# Patient Record
Sex: Female | Born: 1990 | Race: White | Hispanic: No | Marital: Married | Smoking: Never smoker
Health system: Southern US, Community
[De-identification: ages and names within clinical notes are randomized; demographics above are authoritative.]

## PROBLEM LIST (undated history)

## (undated) DIAGNOSIS — Z789 Other specified health status: Secondary | ICD-10-CM

## (undated) HISTORY — PX: CYSTOSCOPY: SUR368

## (undated) HISTORY — PX: WISDOM TOOTH EXTRACTION: SHX21

---

## 2019-12-27 LAB — OB RESULTS CONSOLE GC/CHLAMYDIA
Chlamydia: NEGATIVE
Gonorrhea: NEGATIVE

## 2019-12-27 LAB — OB RESULTS CONSOLE HIV ANTIBODY (ROUTINE TESTING): HIV: NONREACTIVE

## 2019-12-27 LAB — OB RESULTS CONSOLE ANTIBODY SCREEN: Antibody Screen: POSITIVE

## 2019-12-27 LAB — OB RESULTS CONSOLE ABO/RH: RH Type: NEGATIVE

## 2019-12-27 LAB — OB RESULTS CONSOLE RUBELLA ANTIBODY, IGM: Rubella: IMMUNE

## 2019-12-27 LAB — OB RESULTS CONSOLE RPR: RPR: NONREACTIVE

## 2019-12-27 LAB — OB RESULTS CONSOLE HEPATITIS B SURFACE ANTIGEN: Hepatitis B Surface Ag: NEGATIVE

## 2020-07-16 ENCOUNTER — Telehealth (HOSPITAL_COMMUNITY): Payer: Self-pay | Admitting: *Deleted

## 2020-07-16 ENCOUNTER — Encounter (HOSPITAL_COMMUNITY): Payer: Self-pay

## 2020-07-16 NOTE — Telephone Encounter (Signed)
Preadmission screen  

## 2020-07-16 NOTE — Patient Instructions (Addendum)
Karen Perkins  07/16/2020   Your procedure is scheduled on:  07/30/2020  Arrive at 0530 at Entrance C on [employer] [facility] at [facility]  and [employer]. You are invited to use the FREE valet parking or use the Visitor's parking deck.  Pick up the phone at the desk and dial [phone].  Call this number if you have problems the morning of surgery: [phone]  Remember:   Do not eat food:(After Midnight) Desps de medianoche.  Do not drink clear liquids: (After Midnight) Desps de medianoche.  Take these medicines the morning of surgery with A SIP OF WATER:  none   Do not wear jewelry, make-up or nail polish.  Do not wear lotions, powders, or perfumes. Do not wear deodorant.  Do not shave 48 hours prior to surgery.  Do not bring valuables to the hospital.  [facility] is not   responsible for any belongings or valuables brought to the hospital.  Contacts, dentures or bridgework may not be worn into surgery.  Leave suitcase in the car. After surgery it may be brought to your room.  For patients admitted to the hospital, checkout time is 11:00 AM the day of              discharge.      Please read over the following fact sheets that you were given:     Preparing for Surgery

## 2020-07-17 ENCOUNTER — Telehealth (HOSPITAL_COMMUNITY): Payer: Self-pay | Admitting: *Deleted

## 2020-07-17 ENCOUNTER — Encounter (HOSPITAL_COMMUNITY): Payer: Self-pay

## 2020-07-17 NOTE — Telephone Encounter (Signed)
Preadmission screen  

## 2020-07-27 NOTE — H&P (Signed)
Karen Perkins is a 29 y.o. female G2P0010 at 28 weeks presenting for primary C/S secondary to breech presentation.  Patient declined ECV.  Antepartum course uncomplicated.  OB History    Gravida  1   Para      Term      Preterm      AB      Living        SAB      TAB      Ectopic      Multiple      Live Births             Past Medical History:  Diagnosis Date  . Medical history non-contributory    Past Surgical History:  Procedure Laterality Date  . CYSTOSCOPY    . WISDOM TOOTH EXTRACTION     Family History: family history includes Diabetes in her mother. Social History:  reports that she has never smoked. She has never used smokeless tobacco. She reports previous alcohol use. She reports that she does not use drugs.     Maternal Diabetes: No Genetic Screening: Normal Maternal Ultrasounds/Referrals: Normal Fetal Ultrasounds or other Referrals:  None Maternal Substance Abuse:  No Significant Maternal Medications:  None Significant Maternal Lab Results:  Group B Strep negative and Rh negative Other Comments:  None  Review of Systems Maternal Medical History:  Prenatal complications: no prenatal complications Prenatal Complications - Diabetes: none.      There were no vitals taken for this visit. Maternal Exam:  Abdomen: Patient reports no abdominal tenderness. Fundal height is c/w dates.   Estimated fetal weight is 7#8.   Fetal presentation: breech     Physical Exam Constitutional:      Appearance: Normal appearance.  HENT:     Head: Normocephalic and atraumatic.  Pulmonary:     Effort: Pulmonary effort is normal.  Abdominal:     Palpations: Abdomen is soft.  Musculoskeletal:        General: Normal range of motion.     Cervical back: Normal range of motion.  Skin:    General: Skin is warm and dry.  Neurological:     Mental Status: She is alert.  Psychiatric:        Mood and Affect: Mood normal.        Behavior: Behavior normal.      Prenatal labs: ABO, Rh: AB/Negative/-- (01/19 0000) Antibody: Positive (01/19 0000) Rubella: Immune (01/19 0000) RPR: Nonreactive (01/19 0000)  HBsAg: Negative (01/19 0000)  HIV: Non-reactive (01/19 0000)  GBS:   Negative  Assessment/Plan: 28yo G2P0010 at 39 weeks for primary C/S for breech presentation Patient is counseled re: risk of bleeding, infection, scarring, and damage to surrounding structures.  She is informed of implications in future pregnancies including abnormal placentation and uterine rupture.  All questions were answered and patient wishes to proceed.   Mitchel Honour 07/27/2020, 8:26 AM

## 2020-07-28 ENCOUNTER — Other Ambulatory Visit: Payer: Self-pay

## 2020-07-28 ENCOUNTER — Other Ambulatory Visit (HOSPITAL_COMMUNITY)
Admission: RE | Admit: 2020-07-28 | Discharge: 2020-07-28 | Disposition: A | Discharge status: Home / Self Care | Payer: BC Managed Care – PPO | Source: Ambulatory Visit | Attending: Obstetrics and Gynecology | Admitting: Obstetrics and Gynecology

## 2020-07-28 DIAGNOSIS — Z20822 Contact with and (suspected) exposure to covid-19: Secondary | ICD-10-CM | POA: Insufficient documentation

## 2020-07-28 HISTORY — DX: Other specified health status: Z78.9

## 2020-07-28 LAB — CBC WITH DIFFERENTIAL/PLATELET
Abs Immature Granulocytes: 0.03 10*3/uL (ref 0.00–0.07)
Basophils Absolute: 0 10*3/uL (ref 0.0–0.1)
Basophils Relative: 0 %
Eosinophils Absolute: 0.1 10*3/uL (ref 0.0–0.5)
Eosinophils Relative: 1 %
HCT: 35.6 % — ABNORMAL LOW (ref 36.0–46.0)
Hemoglobin: 12.1 g/dL (ref 12.0–15.0)
Immature Granulocytes: 0 %
Lymphocytes Relative: 21 %
Lymphs Abs: 1.5 10*3/uL (ref 0.7–4.0)
MCH: 34.2 pg — ABNORMAL HIGH (ref 26.0–34.0)
MCHC: 34 g/dL (ref 30.0–36.0)
MCV: 100.6 fL — ABNORMAL HIGH (ref 80.0–100.0)
Monocytes Absolute: 0.5 10*3/uL (ref 0.1–1.0)
Monocytes Relative: 7 %
Neutro Abs: 4.9 10*3/uL (ref 1.7–7.7)
Neutrophils Relative %: 71 %
Platelets: 170 10*3/uL (ref 150–400)
RBC: 3.54 MIL/uL — ABNORMAL LOW (ref 3.87–5.11)
RDW: 12.3 % (ref 11.5–15.5)
WBC: 7 10*3/uL (ref 4.0–10.5)
nRBC: 0 % (ref 0.0–0.2)

## 2020-07-28 LAB — TYPE AND SCREEN
ABO/RH(D): AB NEG
Antibody Screen: POSITIVE

## 2020-07-28 LAB — SARS CORONAVIRUS 2 (TAT 6-24 HRS): SARS Coronavirus 2: NEGATIVE

## 2020-07-28 LAB — RPR: RPR Ser Ql: NONREACTIVE

## 2020-07-28 NOTE — MAU Note (Signed)
Pt here for covid swab and lab draw. Denies symptoms or sick contacts. Swab collected.  

## 2020-07-29 NOTE — Anesthesia Preprocedure Evaluation (Addendum)
Anesthesia Evaluation  Patient identified by MRN, date of birth, ID band Patient awake    Reviewed: Allergy & Precautions, H&P , NPO status , Patient's Chart, lab work & pertinent test results  Airway Mallampati: I  TM Distance: >3 FB Neck ROM: Full    Dental no notable dental hx. (+) Teeth Intact, Dental Advisory Given   Pulmonary neg pulmonary ROS,    Pulmonary exam normal breath sounds clear to auscultation       Cardiovascular Exercise Tolerance: Good negative cardio ROS Normal cardiovascular exam Rhythm:Regular Rate:Normal     Neuro/Psych negative neurological ROS  negative psych ROS   GI/Hepatic negative GI ROS, Neg liver ROS,   Endo/Other  negative endocrine ROS  Renal/GU negative Renal ROS  negative genitourinary   Musculoskeletal negative musculoskeletal ROS (+)   Abdominal   Peds negative pediatric ROS (+)  Hematology negative hematology ROS (+) Blood dyscrasia, anemia ,   Anesthesia Other Findings   Reproductive/Obstetrics negative OB ROS (+) Pregnancy                            Anesthesia Physical Anesthesia Plan  ASA: I  Anesthesia Plan: Spinal   Post-op Pain Management:    Induction:   PONV Risk Score and Plan: 2 and Scopolamine patch - Pre-op  Airway Management Planned:   Additional Equipment:   Intra-op Plan:   Post-operative Plan:   Informed Consent: I have reviewed the patients History and Physical, chart, labs and discussed the procedure including the risks, benefits and alternatives for the proposed anesthesia with the patient or authorized representative who has indicated his/her understanding and acceptance.       Plan Discussed with: Anesthesiologist and CRNA  Anesthesia Plan Comments: (  )       Anesthesia Quick Evaluation

## 2020-07-30 ENCOUNTER — Other Ambulatory Visit: Payer: Self-pay

## 2020-07-30 ENCOUNTER — Inpatient Hospital Stay (HOSPITAL_COMMUNITY)
Admission: RE | Admit: 2020-07-30 | Discharge: 2020-08-01 | DRG: 788 | Disposition: A | Discharge status: Home / Self Care | Payer: BC Managed Care – PPO | Attending: Obstetrics & Gynecology | Admitting: Obstetrics & Gynecology

## 2020-07-30 ENCOUNTER — Inpatient Hospital Stay (HOSPITAL_COMMUNITY): Payer: BC Managed Care – PPO | Admitting: Anesthesiology

## 2020-07-30 ENCOUNTER — Encounter (HOSPITAL_COMMUNITY): Payer: Self-pay | Admitting: Obstetrics & Gynecology

## 2020-07-30 ENCOUNTER — Encounter (HOSPITAL_COMMUNITY)
Admission: RE | Disposition: A | Discharge status: Home / Self Care | Payer: Self-pay | Source: Home / Self Care | Attending: Obstetrics & Gynecology

## 2020-07-30 DIAGNOSIS — O26893 Other specified pregnancy related conditions, third trimester: Secondary | ICD-10-CM | POA: Diagnosis present

## 2020-07-30 DIAGNOSIS — O321XX0 Maternal care for breech presentation, not applicable or unspecified: Principal | ICD-10-CM | POA: Diagnosis present

## 2020-07-30 DIAGNOSIS — Z20822 Contact with and (suspected) exposure to covid-19: Secondary | ICD-10-CM | POA: Diagnosis present

## 2020-07-30 DIAGNOSIS — Z3A39 39 weeks gestation of pregnancy: Secondary | ICD-10-CM

## 2020-07-30 DIAGNOSIS — Z6791 Unspecified blood type, Rh negative: Secondary | ICD-10-CM | POA: Diagnosis not present

## 2020-07-30 SURGERY — Surgical Case
Anesthesia: Spinal

## 2020-07-30 MED ORDER — BUPIVACAINE IN DEXTROSE 0.75-8.25 % IT SOLN
INTRATHECAL | Status: DC | PRN
  Administered 2020-07-30: 1.6 mL via INTRATHECAL

## 2020-07-30 MED ORDER — OXYTOCIN-SODIUM CHLORIDE 30-0.9 UT/500ML-% IV SOLN: 2.5000 [IU]/h | INTRAVENOUS | Status: AC

## 2020-07-30 MED ORDER — PHENYLEPHRINE 40 MCG/ML (10ML) SYRINGE FOR IV PUSH (FOR BLOOD PRESSURE SUPPORT)
PREFILLED_SYRINGE | INTRAVENOUS | Status: AC
  Filled 2020-07-30: qty 10

## 2020-07-30 MED ORDER — TETANUS-DIPHTH-ACELL PERTUSSIS 5-2.5-18.5 LF-MCG/0.5 IM SUSP: 0.5000 mL | Freq: Once | INTRAMUSCULAR | Status: DC

## 2020-07-30 MED ORDER — NALOXONE HCL 0.4 MG/ML IJ SOLN: 0.4000 mg | INTRAMUSCULAR | Status: DC | PRN

## 2020-07-30 MED ORDER — NALOXONE HCL 4 MG/10ML IJ SOLN
1.0000 ug/kg/h | INTRAVENOUS | Status: DC | PRN
  Filled 2020-07-30: qty 5

## 2020-07-30 MED ORDER — FENTANYL CITRATE (PF) 100 MCG/2ML IJ SOLN
INTRAMUSCULAR | Status: DC | PRN
Start: 2020-07-30 — End: 2020-07-30
  Administered 2020-07-30: 15 ug via INTRATHECAL

## 2020-07-30 MED ORDER — DIBUCAINE (PERIANAL) 1 % EX OINT: 1.0000 "application " | TOPICAL_OINTMENT | CUTANEOUS | Status: DC | PRN

## 2020-07-30 MED ORDER — DIPHENHYDRAMINE HCL 25 MG PO CAPS: 25.0000 mg | ORAL_CAPSULE | ORAL | Status: DC | PRN

## 2020-07-30 MED ORDER — PHENYLEPHRINE HCL-NACL 20-0.9 MG/250ML-% IV SOLN
INTRAVENOUS | Status: DC | PRN
  Administered 2020-07-30: 60 ug/min via INTRAVENOUS

## 2020-07-30 MED ORDER — DIPHENHYDRAMINE HCL 50 MG/ML IJ SOLN: 12.5000 mg | INTRAMUSCULAR | Status: DC | PRN

## 2020-07-30 MED ORDER — WITCH HAZEL-GLYCERIN EX PADS: 1.0000 "application " | MEDICATED_PAD | CUTANEOUS | Status: DC | PRN

## 2020-07-30 MED ORDER — MENTHOL 3 MG MT LOZG: 1.0000 | LOZENGE | OROMUCOSAL | Status: DC | PRN

## 2020-07-30 MED ORDER — NALBUPHINE HCL 10 MG/ML IJ SOLN
5.0000 mg | Freq: Once | INTRAMUSCULAR | Status: AC | PRN
  Administered 2020-07-31: 5 mg via INTRAVENOUS
  Filled 2020-07-30: qty 1

## 2020-07-30 MED ORDER — LACTATED RINGERS IV SOLN: INTRAVENOUS | Status: DC

## 2020-07-30 MED ORDER — ACETAMINOPHEN 325 MG PO TABS
650.0000 mg | ORAL_TABLET | ORAL | Status: DC | PRN
  Administered 2020-07-30 – 2020-07-31 (×2): 650 mg via ORAL
  Filled 2020-07-30 (×2): qty 2

## 2020-07-30 MED ORDER — SIMETHICONE 80 MG PO CHEW
80.0000 mg | CHEWABLE_TABLET | Freq: Three times a day (TID) | ORAL | Status: DC
  Administered 2020-07-30 – 2020-08-01 (×7): 80 mg via ORAL
  Filled 2020-07-30 (×7): qty 1

## 2020-07-30 MED ORDER — HYDROMORPHONE HCL 1 MG/ML IJ SOLN
INTRAMUSCULAR | Status: AC
  Filled 2020-07-30: qty 0.5

## 2020-07-30 MED ORDER — NALBUPHINE HCL 10 MG/ML IJ SOLN
5.0000 mg | INTRAMUSCULAR | Status: DC | PRN
  Administered 2020-07-31: 5 mg via INTRAVENOUS
  Filled 2020-07-30: qty 1

## 2020-07-30 MED ORDER — SODIUM CHLORIDE 0.9 % IV SOLN
INTRAVENOUS | Status: AC
  Filled 2020-07-30: qty 2

## 2020-07-30 MED ORDER — ONDANSETRON HCL 4 MG/2ML IJ SOLN: 4.0000 mg | Freq: Three times a day (TID) | INTRAMUSCULAR | Status: DC | PRN

## 2020-07-30 MED ORDER — NALBUPHINE HCL 10 MG/ML IJ SOLN: 5.0000 mg | INTRAMUSCULAR | Status: DC | PRN

## 2020-07-30 MED ORDER — KETOROLAC TROMETHAMINE 30 MG/ML IJ SOLN
30.0000 mg | Freq: Four times a day (QID) | INTRAMUSCULAR | Status: AC | PRN
  Administered 2020-07-30: 30 mg via INTRAMUSCULAR

## 2020-07-30 MED ORDER — COCONUT OIL OIL: 1.0000 "application " | TOPICAL_OIL | Status: DC | PRN

## 2020-07-30 MED ORDER — SODIUM CHLORIDE 0.9 % IV SOLN
2.0000 g | INTRAVENOUS | Status: AC
  Administered 2020-07-30: 2 g via INTRAVENOUS

## 2020-07-30 MED ORDER — HYDROMORPHONE HCL 1 MG/ML IJ SOLN
0.5000 mg | Freq: Once | INTRAMUSCULAR | Status: AC
  Administered 2020-07-30: 0.5 mg via INTRAVENOUS

## 2020-07-30 MED ORDER — METOCLOPRAMIDE HCL 5 MG/ML IJ SOLN
INTRAMUSCULAR | Status: AC
  Filled 2020-07-30: qty 2

## 2020-07-30 MED ORDER — KETOROLAC TROMETHAMINE 30 MG/ML IJ SOLN
30.0000 mg | Freq: Four times a day (QID) | INTRAMUSCULAR | Status: AC | PRN
  Administered 2020-07-30: 30 mg via INTRAVENOUS
  Filled 2020-07-30: qty 1

## 2020-07-30 MED ORDER — DEXAMETHASONE SODIUM PHOSPHATE 4 MG/ML IJ SOLN
INTRAMUSCULAR | Status: AC
  Filled 2020-07-30: qty 2

## 2020-07-30 MED ORDER — PRENATAL MULTIVITAMIN CH
1.0000 | ORAL_TABLET | Freq: Every day | ORAL | Status: DC
  Administered 2020-07-31 – 2020-08-01 (×2): 1 via ORAL
  Filled 2020-07-30 (×2): qty 1

## 2020-07-30 MED ORDER — POVIDONE-IODINE 10 % EX SWAB: 2.0000 "application " | Freq: Once | CUTANEOUS | Status: DC

## 2020-07-30 MED ORDER — OXYCODONE-ACETAMINOPHEN 5-325 MG PO TABS
1.0000 | ORAL_TABLET | ORAL | Status: DC | PRN
  Administered 2020-07-30: 2 via ORAL
  Filled 2020-07-30: qty 2

## 2020-07-30 MED ORDER — SCOPOLAMINE 1 MG/3DAYS TD PT72
1.0000 | MEDICATED_PATCH | Freq: Once | TRANSDERMAL | Status: DC
  Administered 2020-07-30: 1.5 mg via TRANSDERMAL

## 2020-07-30 MED ORDER — FENTANYL CITRATE (PF) 100 MCG/2ML IJ SOLN
INTRAMUSCULAR | Status: AC
  Filled 2020-07-30: qty 2

## 2020-07-30 MED ORDER — ONDANSETRON HCL 4 MG/2ML IJ SOLN
INTRAMUSCULAR | Status: AC
  Filled 2020-07-30: qty 2

## 2020-07-30 MED ORDER — MEPERIDINE HCL 25 MG/ML IJ SOLN: 6.2500 mg | INTRAMUSCULAR | Status: DC | PRN

## 2020-07-30 MED ORDER — SODIUM CHLORIDE 0.9 % IR SOLN
Status: DC | PRN
  Administered 2020-07-30: 1

## 2020-07-30 MED ORDER — IBUPROFEN 800 MG PO TABS
800.0000 mg | ORAL_TABLET | Freq: Four times a day (QID) | ORAL | Status: DC
  Administered 2020-07-31 – 2020-08-01 (×7): 800 mg via ORAL
  Filled 2020-07-30 (×8): qty 1

## 2020-07-30 MED ORDER — OXYTOCIN-SODIUM CHLORIDE 30-0.9 UT/500ML-% IV SOLN
INTRAVENOUS | Status: AC
  Filled 2020-07-30: qty 500

## 2020-07-30 MED ORDER — SENNOSIDES-DOCUSATE SODIUM 8.6-50 MG PO TABS
2.0000 | ORAL_TABLET | ORAL | Status: DC
  Administered 2020-07-31 (×2): 2 via ORAL
  Filled 2020-07-30 (×2): qty 2

## 2020-07-30 MED ORDER — PHENYLEPHRINE HCL-NACL 20-0.9 MG/250ML-% IV SOLN
INTRAVENOUS | Status: AC
  Filled 2020-07-30: qty 250

## 2020-07-30 MED ORDER — NALBUPHINE HCL 10 MG/ML IJ SOLN: 5.0000 mg | Freq: Once | INTRAMUSCULAR | Status: AC | PRN

## 2020-07-30 MED ORDER — SIMETHICONE 80 MG PO CHEW
80.0000 mg | CHEWABLE_TABLET | ORAL | Status: DC
  Administered 2020-07-31 (×2): 80 mg via ORAL
  Filled 2020-07-30 (×2): qty 1

## 2020-07-30 MED ORDER — MORPHINE SULFATE (PF) 0.5 MG/ML IJ SOLN
INTRAMUSCULAR | Status: DC | PRN
  Administered 2020-07-30: .15 ug via INTRATHECAL

## 2020-07-30 MED ORDER — METOCLOPRAMIDE HCL 5 MG/ML IJ SOLN
INTRAMUSCULAR | Status: DC | PRN
  Administered 2020-07-30 (×2): 5 mg via INTRAVENOUS

## 2020-07-30 MED ORDER — DIPHENHYDRAMINE HCL 25 MG PO CAPS: 25.0000 mg | ORAL_CAPSULE | Freq: Four times a day (QID) | ORAL | Status: DC | PRN

## 2020-07-30 MED ORDER — ZOLPIDEM TARTRATE 5 MG PO TABS: 5.0000 mg | ORAL_TABLET | Freq: Every evening | ORAL | Status: DC | PRN

## 2020-07-30 MED ORDER — SIMETHICONE 80 MG PO CHEW: 80.0000 mg | CHEWABLE_TABLET | ORAL | Status: DC | PRN

## 2020-07-30 MED ORDER — SODIUM CHLORIDE 0.9% FLUSH: 3.0000 mL | INTRAVENOUS | Status: DC | PRN

## 2020-07-30 MED ORDER — SCOPOLAMINE 1 MG/3DAYS TD PT72
MEDICATED_PATCH | TRANSDERMAL | Status: AC
  Filled 2020-07-30: qty 1

## 2020-07-30 MED ORDER — MORPHINE SULFATE (PF) 0.5 MG/ML IJ SOLN
INTRAMUSCULAR | Status: AC
  Filled 2020-07-30: qty 10

## 2020-07-30 MED ORDER — KETOROLAC TROMETHAMINE 30 MG/ML IJ SOLN
INTRAMUSCULAR | Status: AC
  Filled 2020-07-30: qty 1

## 2020-07-30 MED ORDER — OXYTOCIN-SODIUM CHLORIDE 30-0.9 UT/500ML-% IV SOLN
INTRAVENOUS | Status: DC | PRN
  Administered 2020-07-30: 300 mL via INTRAVENOUS

## 2020-07-30 MED ORDER — LACTATED RINGERS IV SOLN: INTRAVENOUS | Status: DC | PRN

## 2020-07-30 SURGICAL SUPPLY — 36 items
BENZOIN TINCTURE PRP APPL 2/3 (GAUZE/BANDAGES/DRESSINGS) ×3 IMPLANT
CHLORAPREP W/TINT 26ML (MISCELLANEOUS) ×3 IMPLANT
CLAMP CORD UMBIL (MISCELLANEOUS) IMPLANT
CLOSURE STERI STRIP 1/2 X4 (GAUZE/BANDAGES/DRESSINGS) ×3 IMPLANT
CLOSURE WOUND 1/2 X4 (GAUZE/BANDAGES/DRESSINGS)
CLOTH BEACON ORANGE TIMEOUT ST (SAFETY) ×3 IMPLANT
DERMABOND ADVANCED (GAUZE/BANDAGES/DRESSINGS)
DERMABOND ADVANCED .7 DNX12 (GAUZE/BANDAGES/DRESSINGS) IMPLANT
DRSG OPSITE POSTOP 4X10 (GAUZE/BANDAGES/DRESSINGS) ×3 IMPLANT
DRSG PAD ABDOMINAL 8X10 ST (GAUZE/BANDAGES/DRESSINGS) ×3 IMPLANT
ELECT REM PT RETURN 9FT ADLT (ELECTROSURGICAL) ×3
ELECTRODE REM PT RTRN 9FT ADLT (ELECTROSURGICAL) ×1 IMPLANT
EXTRACTOR VACUUM KIWI (MISCELLANEOUS) IMPLANT
GAUZE SPONGE 4X4 3PLY NS LF (GAUZE/BANDAGES/DRESSINGS) ×6 IMPLANT
GLOVE BIO SURGEON STRL SZ 6 (GLOVE) ×3 IMPLANT
GLOVE BIOGEL PI IND STRL 6 (GLOVE) ×2 IMPLANT
GLOVE BIOGEL PI IND STRL 7.0 (GLOVE) ×1 IMPLANT
GLOVE BIOGEL PI INDICATOR 6 (GLOVE) ×4
GLOVE BIOGEL PI INDICATOR 7.0 (GLOVE) ×2
GOWN STRL REUS W/TWL LRG LVL3 (GOWN DISPOSABLE) ×6 IMPLANT
KIT ABG SYR 3ML LUER SLIP (SYRINGE) ×3 IMPLANT
NEEDLE HYPO 25X5/8 SAFETYGLIDE (NEEDLE) ×3 IMPLANT
NS IRRIG 1000ML POUR BTL (IV SOLUTION) ×3 IMPLANT
PACK C SECTION WH (CUSTOM PROCEDURE TRAY) ×3 IMPLANT
PAD OB MATERNITY 4.3X12.25 (PERSONAL CARE ITEMS) ×3 IMPLANT
PENCIL SMOKE EVAC W/HOLSTER (ELECTROSURGICAL) ×3 IMPLANT
STRIP CLOSURE SKIN 1/2X4 (GAUZE/BANDAGES/DRESSINGS) IMPLANT
SUT CHROMIC 0 CTX 36 (SUTURE) ×9 IMPLANT
SUT MON AB 2-0 CT1 27 (SUTURE) ×3 IMPLANT
SUT PDS AB 0 CT1 27 (SUTURE) IMPLANT
SUT PLAIN 0 NONE (SUTURE) IMPLANT
SUT VIC AB 0 CT1 36 (SUTURE) IMPLANT
SUT VIC AB 4-0 KS 27 (SUTURE) IMPLANT
TOWEL OR 17X24 6PK STRL BLUE (TOWEL DISPOSABLE) ×3 IMPLANT
TRAY FOLEY W/BAG SLVR 14FR LF (SET/KITS/TRAYS/PACK) IMPLANT
WATER STERILE IRR 1000ML POUR (IV SOLUTION) ×3 IMPLANT

## 2020-07-30 NOTE — Op Note (Signed)
Karen Perkins PROCEDURE DATE: 07/30/2020  PREOPERATIVE DIAGNOSIS: Intrauterine pregnancy at  [redacted]w[redacted]d weeks gestation, breech presentation  POSTOPERATIVE DIAGNOSIS: The same  PROCEDURE:  Primary Low Transverse Cesarean Section  SURGEON:  Dr. Mitchel Honour  INDICATIONS: Karen Perkins is a 29 y.o. G1P0 at [redacted]w[redacted]d scheduled for cesarean section secondary to breech presentation.  The risks of cesarean section discussed with the patient included but were not limited to: bleeding which may require transfusion or reoperation; infection which may require antibiotics; injury to bowel, bladder, ureters or other surrounding organs; injury to the fetus; need for additional procedures including hysterectomy in the event of a life-threatening hemorrhage; placental abnormalities wth subsequent pregnancies, incisional problems, thromboembolic phenomenon and other postoperative/anesthesia complications. The patient concurred with the proposed plan, giving informed written consent for the procedure.    FINDINGS:  Viable female infant in frank breech presentation, APGARs 8,9: weight pending  Clear amniotic fluid.  Intact placenta, three vessel cord.  Grossly normal uterus, ovaries and fallopian tubes. .   ANESTHESIA:  Spinal ESTIMATED BLOOD LOSS: 600 ml SPECIMENS: Placenta sent to L&D COMPLICATIONS: None immediate  PROCEDURE IN DETAIL:  The patient received intravenous antibiotics and had sequential compression devices applied to her lower extremities while in the preoperative area.  She was then taken to the operating room where spinal anesthesia was administered and was found to be adequate. She was then placed in a dorsal supine position with a leftward tilt, and prepped and draped in a sterile manner.  A foley catheter was placed into her bladder and attached to constant gravity.  After an adequate timeout was performed, a Pfannenstiel skin incision was made with scalpel and carried through to the underlying layer of  fascia. The fascia was incised in the midline and this incision was extended bilaterally using the Mayo scissors. Kocher clamps were applied to the superior aspect of the fascial incision and the underlying rectus muscles were dissected off bluntly. A similar process was carried out on the inferior aspect of the facial incision. The rectus muscles were separated in the midline bluntly and the peritoneum was entered bluntly. Bladder flap was created sharply and developed bluntly.  Bladder blade was placed.  A transverse hysterotomy was made with a scalpel and extended bilaterally bluntly. The bladder blade was then removed. The infant was successfully delivered using typical breech maneuvers, and cord was clamped and cut and infant was handed over to awaiting neonatology team. Uterine massage was then administered and the placenta delivered intact with three-vessel cord. The uterus was cleared of clot and debris.  The hysterotomy was closed with 0 chromic.  A second imbricating suture of 0-chromic was used to reinforce the incision and aid in hemostasis.  The peritoneum and rectus muscles were noted to be hemostatic and were reapproximated usind 3-0 monocryl in a running fashion.  The fascia was closed with 0-Vicryl in a running fashion with good restoration of anatomy.  The subcutaneus tissue was copiously irrigated.  The skin was closed with 4-0 vicryl in a subcuticular fashion.  Pt tolerated the procedure will.  All counts were correct x2.  Pt went to the recovery room in stable condition.

## 2020-07-30 NOTE — Lactation Note (Signed)
This note was copied from a baby's chart. Lactation Consultation Note  Patient Name: Karen Perkins OEVOJ'J Date: 07/30/2020 Reason for consult: Initial assessment;Primapara;1st time breastfeeding;Term  Visited with mom of a 64 hours old FT female, she's a P1 and reported moderate breast changes during the pregnancy. She has a Spectra DEBP at home. RN Misty Stanley asked LC to assist mother due to a difficult latch, baby had a good feeding right after birth but he's been sleepy since then.  RN Misty Stanley had already assisted with hand expression since baby didn't latch, mom and baby doing STS when entering the room. Offered assistance with latch and mom agreed to wake baby up to feed. LC positioned baby in cross cradle hold at the right breast first and he latch for about 3 minutes, but required some repositioning. A few audible swallows noted when doing breast compressions.   Mom had her IV on that hand and asked LC to switch breast instead, baby latched again but only for about 2 minutes on the other breast. Baby kept falling asleep, when mom asked LC to try football hold, baby started crying and shivering, LC put on baby's hat. Baby only did a few sucks on football hold, LC then assisted mom with hand expression and spoon feeding and fed baby about 1 ml of colostrum.  LC placed baby back to mom's chest doing STS and asked her to call for assistance when needed; baby fell asleep shortly after. Reviewed normal newborn behavior, cluster feeding, feeding cues, size of baby's stomach and breastfeeding tools (such as pumping and NS) in case baby is still having difficulty latching on by the 24 hours mark.  Feeding plan:  1. Encouraged mom to feed baby STS 8-12 times/24 hours or sooner if feeding cues are present 2. Hand expression and spoon feeding were strongly encouraged, especially if baby is not latching/cueing  BF brochure, BF resources and feeding diary were reviewed. Dad present and supportive. Parents  reported all questions and concerns were answered, they're both aware of LC OP services and will call PRN.   Maternal Data Formula Feeding for Exclusion: No Has patient been taught Hand Expression?: Yes Does the patient have breastfeeding experience prior to this delivery?: No  Feeding Feeding Type: Breast Fed  LATCH Score Latch: Repeated attempts needed to sustain latch, nipple held in mouth throughout feeding, stimulation needed to elicit sucking reflex. (on and off)  Audible Swallowing: A few with stimulation (only when compressing)  Type of Nipple: Everted at rest and after stimulation  Comfort (Breast/Nipple): Soft / non-tender  Hold (Positioning): Assistance needed to correctly position infant at breast and maintain latch.  LATCH Score: 7  Interventions Interventions: Breast feeding basics reviewed;Assisted with latch;Skin to skin;Breast massage;Hand express;Breast compression;Adjust position;Support pillows;Position options  Lactation Tools Discussed/Used WIC Program: No   Consult Status Consult Status: Follow-up Date: 07/31/20 Follow-up type: In-patient    Liyla Radliff Venetia Constable 07/30/2020, 7:43 PM

## 2020-07-30 NOTE — Transfer of Care (Signed)
Immediate Anesthesia Transfer of Care Note  Patient: Karen Perkins  Procedure(s) Performed: PRIMARY CESAREAN SECTION EDC: 08-06-20  NKDA  NEED RNFA (N/A )  Patient Location: PACU  Anesthesia Type:Spinal  Level of Consciousness: awake, alert  and oriented  Airway & Oxygen Therapy: Patient Spontanous Breathing  Post-op Assessment: Report given to RN and Post -op Vital signs reviewed and stable  Post vital signs: Reviewed and stable  Last Vitals:  Vitals Value Taken Time  BP 113/58 07/30/20 0823  Temp    Pulse 71 07/30/20 0825  Resp 21 07/30/20 0825  SpO2 95 % 07/30/20 0825  Vitals shown include unvalidated device data.  Last Pain:  Vitals:   07/30/20 0600  TempSrc: Oral         Complications: No complications documented.

## 2020-07-30 NOTE — Anesthesia Postprocedure Evaluation (Signed)
Anesthesia Post Note  Patient: Karen Perkins  Procedure(s) Performed: PRIMARY CESAREAN SECTION EDC: 08-06-20  NKDA  NEED RNFA (N/A )     Patient location during evaluation: PACU Anesthesia Type: Spinal Level of consciousness: oriented and awake and alert Pain management: pain level controlled Vital Signs Assessment: post-procedure vital signs reviewed and stable Respiratory status: spontaneous breathing, respiratory function stable and patient connected to nasal cannula oxygen Cardiovascular status: blood pressure returned to baseline and stable Postop Assessment: no headache, no backache and no apparent nausea or vomiting Anesthetic complications: no   No complications documented.  Last Vitals:  Vitals:   07/30/20 0823 07/30/20 0830  BP: (!) 113/58 112/63  Pulse: 73 73  Resp: 20 20  Temp: 36.6 C   SpO2: 95% 95%    Last Pain:  Vitals:   07/30/20 0600  TempSrc: Oral   Pain Goal:    LLE Motor Response: No movement due to regional block, No movement to painful stimulus (07/30/20 0830) LLE Sensation: No sensation (absent) (07/30/20 0830) RLE Motor Response: No movement due to regional block, No movement to painful stimulus (07/30/20 0830) RLE Sensation: No sensation (absent) (07/30/20 0830)     Epidural/Spinal Function Cutaneous sensation: No Sensation (07/30/20 0830), Patient able to flex knees: No (07/30/20 0830), Patient able to lift hips off bed: No (07/30/20 0830), Back pain beyond tenderness at insertion site: No (07/30/20 0830), Progressively worsening motor and/or sensory loss: No (07/30/20 0830), Bowel and/or bladder incontinence post epidural: No (07/30/20 0830)  Ansel Ferrall

## 2020-07-30 NOTE — Progress Notes (Signed)
No change to H&P.  Bedside u/s confirms breech presentation.  Richerd Grime, DO 

## 2020-07-30 NOTE — Progress Notes (Signed)
Karen Perkins wishes to work with lactation during her stay.

## 2020-07-30 NOTE — Anesthesia Procedure Notes (Signed)
Spinal  Patient location during procedure: OR Start time: 07/30/2020 7:20 AM End time: 07/30/2020 7:25 AM Staffing Anesthesiologist: Bethena Midget, MD Preanesthetic Checklist Completed: patient identified, IV checked, site marked, risks and benefits discussed, surgical consent, monitors and equipment checked, pre-op evaluation and timeout performed Spinal Block Patient position: sitting Prep: DuraPrep Patient monitoring: heart rate, cardiac monitor, continuous pulse ox and blood pressure Approach: midline Location: L4-5 Injection technique: single-shot Needle Needle type: Sprotte  Needle gauge: 24 G Needle length: 9 cm Assessment Sensory level: T6

## 2020-07-31 LAB — CBC
HCT: 29.7 % — ABNORMAL LOW (ref 36.0–46.0)
Hemoglobin: 10.1 g/dL — ABNORMAL LOW (ref 12.0–15.0)
MCH: 34.2 pg — ABNORMAL HIGH (ref 26.0–34.0)
MCHC: 34 g/dL (ref 30.0–36.0)
MCV: 100.7 fL — ABNORMAL HIGH (ref 80.0–100.0)
Platelets: 168 10*3/uL (ref 150–400)
RBC: 2.95 MIL/uL — ABNORMAL LOW (ref 3.87–5.11)
RDW: 12.6 % (ref 11.5–15.5)
WBC: 12.2 10*3/uL — ABNORMAL HIGH (ref 4.0–10.5)
nRBC: 0 % (ref 0.0–0.2)

## 2020-07-31 LAB — BIRTH TISSUE RECOVERY COLLECTION (PLACENTA DONATION)

## 2020-07-31 MED ORDER — RHO D IMMUNE GLOBULIN 1500 UNIT/2ML IJ SOSY
300.0000 ug | PREFILLED_SYRINGE | Freq: Once | INTRAMUSCULAR | Status: AC
  Administered 2020-07-31: 300 ug via INTRAVENOUS
  Filled 2020-07-31: qty 2

## 2020-07-31 NOTE — Lactation Note (Signed)
This note was copied from a baby's chart. Lactation Consultation Note  Patient Name: Karen Perkins INOMV'E Date: 07/31/2020 Reason for consult: Follow-up assessment   Mother reports that breastfeeding is going well. She declines having any questions. She does report slight soreness of her nipples. Observed that her nipple are pink. She is applying colostrum when hand expresses.  Mother reports that she is distracted due to nurse assessing infant. She reports she would like to have LC visit later.    Maternal Data    Feeding    LATCH Score                   Interventions    Lactation Tools Discussed/Used     Consult Status Consult Status: Follow-up Date: 07/31/20 Follow-up type: In-patient    Stevan Born [facility] 07/31/2020, 3:01 PM

## 2020-07-31 NOTE — Progress Notes (Signed)
Subjective: Postpartum Day 1: Cesarean Delivery Patient reports tolerating PO, + flatus and no problems voiding.  Desires circ.  Objective: Vital signs in last 24 hours: Temp:  [97.5 F (36.4 C)-98.1 F (36.7 C)] 98 F (36.7 C) (08/24 0300) Pulse Rate:  [53-65] 64 (08/24 0300) Resp:  [16-18] 18 (08/24 0300) BP: (83-116)/(53-82) 116/82 (08/24 0300) SpO2:  [96 %-99 %] 98 % (08/23 1700)  Physical Exam:  General: alert, cooperative and appears stated age Lochia: appropriate Uterine Fundus: firm Incision: healing well, no significant drainage, no dehiscence; pressure dressing moved.  Honeycomb in place with half-dollar sized stain on right side of dressing. DVT Evaluation: No evidence of DVT seen on physical exam. Negative Homan's sign. No cords or calf tenderness.  Recent Labs    07/31/20 0434  HGB 10.1*  HCT 29.7*    Assessment/Plan: Status post Cesarean section. Doing well postoperatively.  Continue current care. Circ-patient counseled re: risk of bleeding, infection and scarring.  All questions were answered and patient wishes to proceed.   Mitchel Honour 07/31/2020, 8:43 AM

## 2020-08-01 LAB — RH IG WORKUP (INCLUDES ABO/RH)
ABO/RH(D): AB NEG
Fetal Screen: NEGATIVE
Gestational Age(Wks): 39
Unit division: 0

## 2020-08-01 MED ORDER — ACETAMINOPHEN 325 MG PO TABS: 650.0000 mg | ORAL_TABLET | Freq: Four times a day (QID) | ORAL | 0 refills | Status: AC | PRN

## 2020-08-01 MED ORDER — IBUPROFEN 800 MG PO TABS
800.0000 mg | ORAL_TABLET | Freq: Four times a day (QID) | ORAL | 0 refills | Status: DC | PRN
Start: 2020-08-01 — End: 2023-02-05

## 2020-08-01 NOTE — Progress Notes (Signed)
Subjective: Postpartum Day 2: Cesarean Delivery Patient reports tolerating PO, + flatus and no problems voiding.   Baby was "inconsolable" last pm. Patient is very tired and uncertain if she wants to go home today Objective: Vital signs in last 24 hours: Temp:  [98.1 F (36.7 C)-98.5 F (36.9 C)] 98.5 F (36.9 C) (08/25 0658) Pulse Rate:  [62-84] 84 (08/25 0658) Resp:  [16-18] 16 (08/25 0658) BP: (110-118)/(62-73) 112/62 (08/25 0658) SpO2:  [98 %] 98 % (08/25 7915)  Physical Exam:  General: alert, cooperative and no distress Lochia: appropriate Uterine Fundus: firm Incision: healing well DVT Evaluation: No evidence of DVT seen on physical exam.  Recent Labs    07/31/20 0434  HGB 10.1*  HCT 29.7*    Assessment/Plan: Status post Cesarean section. Doing well postoperatively.  Continue current care. D/W discharge instructions in case she wants discharge later today. I encouraged her to use resources here at hospital if she needs the support.  Roselle Locus II 08/01/2020, 7:21 AM

## 2020-08-01 NOTE — Lactation Note (Addendum)
This note was copied from a baby's chart. Lactation Consultation Note  Patient Name: Boy Karletta Millay TDVVO'[id number] Date: 08/01/2020 Reason for consult: Follow-up assessment  Baby boy Mungro circumsized today.  Baby boy Mungro breech born via csection.Marland Kitchen  He is very fussy on arrival.  Parents report recently fed for  20 minutes and he is still fussy. Infant with 6.5 percent weight loss, mom has abrasions on both nipples. Did not do NEWT at this time. Parents report they feel he is breastfeeding okay.  Discussed with parents possible supplementation.  Discussed breastmilk, formula or donor milk.  Donor milk not available at this time.  As I am talking with parents, mom started crying and dad asked why they need to supplement.  Mom reports this is just all overwhelming.  Dad reports she hadn't had any sleep. Asked mom if we could try some hand expression and spoon feeding and intitiate pumping.   Easily able to hand express and spoon feed. Fed infant close to 10 ml via spoon. Attempt to get mom to help. Mom puts hand close to nipple and LC not able to get spoon under to catch drops.  Mom agreed to try pumping with DEBP.  Mom used DEBP   and got about 5 ml of colsotrum.  LC attempt to get mom to sit up more to be able to save more of the colostrum.  At this time mom feels like she can't.  Mom wants to save what she got with pumping and feed past the next feed.  Urged mom if she feels adding the steps is to overwhelming to at least add the massage and hand expression past breastfeeding and try to give at least two spoonfulls.  Stayed duration of pumping. Parents in agreement.  Reviewed supplementation amounts with parents and gave sheet with recommended amounts. Urged her to call lactation as needed. Feeding Feeding Type: Breast Fed  LATCH Score Latch: Grasps breast easily, tongue down, lips flanged, rhythmical sucking.  Audible Swallowing: Spontaneous and intermittent  Type of Nipple: Everted at rest and after  stimulation  Comfort (Breast/Nipple): Filling, red/small blisters or bruises, mild/mod discomfort  Hold (Positioning): No assistance needed to correctly position infant at breast.  LATCH Score: 9  Interventions Interventions: Breast feeding basics reviewed;Breast massage;Hand express;Expressed milk;Hand pump;DEBP  Lactation Tools Discussed/Used Date initiated:: 07/31/20   Consult Status Consult Status: Follow-up Date: 08/01/20 Follow-up type: In-patient    Nadelyn Enriques Michaelle Copas 08/01/2020, 1:08 AM

## 2020-08-01 NOTE — Lactation Note (Signed)
This note was copied from a baby's chart. Lactation Consultation Note  Patient Name: Karen Perkins Date: 08/01/2020 Reason for consult: Follow-up assessment;Primapara;1st time breastfeeding;Term  1340 - 1355 - I followed up with Ms. Conant. She just breast fed her son prior to entry, and her support person was preparing to bottle feed. She had the supplementation guidelines in hand.  She states that baby's latch has improved since last night. However, she has two compression stripes on her nipples. We discussed causes for this nipple trauma.  At this time, she declined latch assistance. She would like discharge today. She will follow up with [facility] this Friday. She states that they have lactation at the office and will try to follow up with the IBCLC there. I encouraged this.  I reviewed her feeding plan for discharge. She is to breast feed baby on demand 8-12 times a day. I recommended offering both breasts in a feeding. Her support person will supplement formula or EBM. I recommended that she post-pump and use her EBM as it transitions and increases in volume. I recommended pumping at least several times a day to help with stimulation. Ms. Zipp verbalized understanding.  I reviewed our community breast feeding resources and encouraged Ms. Fiallo to join the support groups.   Ms. Stillson has coconut oil. I reviewed hand expression and recommended that she rub her colostrum onto her nipples.  Maternal Data Does the patient have breastfeeding experience prior to this delivery?: No  Feeding Feeding Type: Bottle Fed - Formula  Interventions Interventions: Breast feeding basics reviewed  Lactation Tools Discussed/Used Pump Review: Setup, frequency, and cleaning   Consult Status Consult Status: Follow-up Date: 08/02/20 Follow-up type: In-patient    Karen Perkins 08/01/2020, 2:39 PM

## 2020-08-01 NOTE — Discharge Summary (Signed)
Postpartum Discharge Summary  Date of Service updated 08/01/20     Patient Name: Karen Perkins DOB: 08-09-1991 MRN: [id number]  Date of admission: 07/30/2020 Delivery date:07/30/2020  Delivering provider: Linda Hedges  Date of discharge: 08/01/2020  Admitting diagnosis: Breech presentation [O32.1XX0] Cesarean delivery delivered [O82] Intrauterine pregnancy: [redacted]w[redacted]d     Secondary diagnosis:  Active Problems:   Breech presentation   Cesarean delivery delivered  Additional problems:     Discharge diagnosis: Term Pregnancy Delivered                                              Post partum procedures: Augmentation: N/A Complications: None  Hospital course: Sceduled C/S   29 y.o. yo G1P1001 at [redacted]w[redacted]d was admitted to the hospital 07/30/2020 for scheduled cesarean section with the following indication:Malpresentation.Delivery details are as follows:  Membrane Rupture Time/Date: 7:42 AM ,07/30/2020   Delivery Method:C-Section, Low Transverse  Details of operation can be found in separate operative note.  Patient had an uncomplicated postpartum course.  She is ambulating, tolerating a regular diet, passing flatus, and urinating well. Patient is discharged home in stable condition on  08/01/20        Newborn Data: Birth date:07/30/2020  Birth time:7:42 AM  Gender:Female  Living status:Living  Apgars:8 ,9  Weight:3480 g     Magnesium Sulfate received: No BMZ received: No Rhophylac:No MMR:No T-DaP:Given prenatally Flu: No Transfusion:No  Physical exam  Vitals:   07/31/20 1440 07/31/20 1940 08/01/20 0658 08/01/20 1343  BP: 110/73 118/62 112/62 113/75  Pulse: 62 73 84 68  Resp: $Remo'18 16 16 16  'VDhrH$ Temp: 98.1 F (36.7 C) 98.2 F (36.8 C) 98.5 F (36.9 C) 98 F (36.7 C)  TempSrc:  Oral Oral Oral  SpO2:  98% 98%   Weight:      Height:       General: alert, cooperative and no distress Lochia: appropriate Uterine Fundus: firm Incision: Healing well with no significant drainage DVT  Evaluation: No evidence of DVT seen on physical exam. Labs: Lab Results  Component Value Date   WBC 12.2 (H) 07/31/2020   HGB 10.1 (L) 07/31/2020   HCT 29.7 (L) 07/31/2020   MCV 100.7 (H) 07/31/2020   PLT 168 07/31/2020   No flowsheet data found. Edinburgh Score: Edinburgh Postnatal Depression Scale Screening Tool 07/30/2020  I have been able to laugh and see the funny side of things. 0  I have looked forward with enjoyment to things. 0  I have blamed myself unnecessarily when things went wrong. 1  I have been anxious or worried for no good reason. 2  I have felt scared or panicky for no good reason. 1  Things have been getting on top of me. 1  I have been so unhappy that I have had difficulty sleeping. 0  I have felt sad or miserable. 1  I have been so unhappy that I have been crying. 0  The thought of harming myself has occurred to me. 0  Edinburgh Postnatal Depression Scale Total 6      After visit meds:  Allergies as of 08/01/2020   No Known Allergies     Medication List    STOP taking these medications   famotidine-calcium carbonate-magnesium hydroxide 10-800-165 MG chewable tablet Commonly known as: PEPCID COMPLETE     TAKE these medications   acetaminophen 325 MG  tablet Commonly known as: TYLENOL Take 2 tablets (650 mg total) by mouth every 6 (six) hours as needed for mild pain (temperature > 101.5.). What changed:   medication strength  how much to take  reasons to take this   ibuprofen 800 MG tablet Commonly known as: ADVIL Take 1 tablet (800 mg total) by mouth every 6 (six) hours as needed.   PRENATAL PO Take 1 tablet by mouth every evening.        Discharge home in stable condition Infant Feeding: Breast Infant Disposition:home with mother Discharge instruction: per After Visit Summary and Postpartum booklet. Activity: Advance as tolerated. Pelvic rest for 6 weeks.  Diet: routine diet Anticipated Birth Control: Unsure Postpartum  Appointment:6 weeks Additional Postpartum F/U:  Future Appointments:No future appointments. Follow up Visit:      08/01/2020 Allena Katz, MD

## 2020-09-22 ENCOUNTER — Encounter (HOSPITAL_BASED_OUTPATIENT_CLINIC_OR_DEPARTMENT_OTHER): Payer: Self-pay | Admitting: Emergency Medicine

## 2020-09-22 ENCOUNTER — Other Ambulatory Visit: Payer: Self-pay

## 2020-09-22 ENCOUNTER — Emergency Department (HOSPITAL_BASED_OUTPATIENT_CLINIC_OR_DEPARTMENT_OTHER)
Admission: EM | Admit: 2020-09-22 | Discharge: 2020-09-22 | Disposition: A | Discharge status: Home / Self Care | Payer: BC Managed Care – PPO | Attending: Emergency Medicine | Admitting: Emergency Medicine

## 2020-09-22 DIAGNOSIS — N644 Mastodynia: Secondary | ICD-10-CM | POA: Diagnosis present

## 2020-09-22 DIAGNOSIS — N61 Mastitis without abscess: Secondary | ICD-10-CM

## 2020-09-22 DIAGNOSIS — R509 Fever, unspecified: Secondary | ICD-10-CM | POA: Insufficient documentation

## 2020-09-22 LAB — CBC WITH DIFFERENTIAL/PLATELET
Abs Immature Granulocytes: 0.1 10*3/uL — ABNORMAL HIGH (ref 0.00–0.07)
Basophils Absolute: 0.1 10*3/uL (ref 0.0–0.1)
Basophils Relative: 0 %
Eosinophils Absolute: 0.3 10*3/uL (ref 0.0–0.5)
Eosinophils Relative: 2 %
HCT: 34.9 % — ABNORMAL LOW (ref 36.0–46.0)
Hemoglobin: 11.8 g/dL — ABNORMAL LOW (ref 12.0–15.0)
Immature Granulocytes: 1 %
Lymphocytes Relative: 8 %
Lymphs Abs: 1.2 10*3/uL (ref 0.7–4.0)
MCH: 32.7 pg (ref 26.0–34.0)
MCHC: 33.8 g/dL (ref 30.0–36.0)
MCV: 96.7 fL (ref 80.0–100.0)
Monocytes Absolute: 1.3 10*3/uL — ABNORMAL HIGH (ref 0.1–1.0)
Monocytes Relative: 8 %
Neutro Abs: 12.7 10*3/uL — ABNORMAL HIGH (ref 1.7–7.7)
Neutrophils Relative %: 81 %
Platelets: 248 10*3/uL (ref 150–400)
RBC: 3.61 MIL/uL — ABNORMAL LOW (ref 3.87–5.11)
RDW: 11.7 % (ref 11.5–15.5)
WBC: 15.7 10*3/uL — ABNORMAL HIGH (ref 4.0–10.5)
nRBC: 0 % (ref 0.0–0.2)

## 2020-09-22 NOTE — ED Triage Notes (Signed)
Pt here with left breast swelling and pain presenting similar to mastitis. Currently weaning child off breastfeeding and has been experiencing fevers and pain for the last 24 hours. Was directed by UC to come here if the sx got worse. Was Rx abx yesterday.

## 2020-09-22 NOTE — Discharge Instructions (Addendum)
seen here for acute mastitis.  Continues with your medications as prescribed.  recommend taking Tylenol for fever control and ibuprofen for pain.  Please follow dosing on the back of the bottle.  Please apply warm compresses on the area to help with swelling inflammation.  I would also continue to use the pump as this will also help decrease swelling and inflammation.  It is important that you follow-up with your OB/GYN and imaging center for further evaluation management. I have Given the contact information please call them to get an appointment  Come back to the emergency department if you develop worsening fevers, chills, shortness of breath, chest pain, vomiting, nausea vomiting diarrhea.

## 2020-09-22 NOTE — ED Provider Notes (Signed)
[facility] EMERGENCY DEPARTMENT Provider Note   CSN: [id number] Arrival date & time: 09/22/20  1336     History Chief Complaint  Patient presents with  . Breast Pain    Karen Perkins is a 29 y.o. female.  HPI   Patient with no significant medical history presents to the emergency  department with chief complaint of left breast pain.  Patient states she has had increasing swelling and pain on the left side of her breast.  Patient states she is currently breast-feeding and has noticed that the area appears to be red and inflamed.  She explains it is very tender to the touch and has  Not been able to get much milk out of her breast.  She was recently seen at urgent care yesterday where they were concerned for mastitis versus possible abscess.  They placed patient on antibiotics and informed her to come to the emergent department if symptoms worsening.  She is here because she states she had a low-grade fever today worsening pain.  She denies rest discharge, wound drainage, she is not immunocompromise has no significant medical history.  Patient denies any alleviating factors at this time.  Patient denies headaches, cough, congestion, chest pain, shortness of breath, abdominal pain, nausea, vomiting, diarrhea, pedal edema.  Past Medical History:  Diagnosis Date  . Medical history non-contributory     Patient Active Problem List   Diagnosis Date Noted  . Breech presentation 07/30/2020  . Cesarean delivery delivered 07/30/2020    Past Surgical History:  Procedure Laterality Date  . CESAREAN SECTION N/A 07/30/2020   Procedure: PRIMARY CESAREAN SECTION EDC: 08-06-20  NKDA  NEED RNFA;  Surgeon: Mitchel Honour, DO;  Location: [facility] LD ORS;  Service: Obstetrics;  Laterality: N/A;  Heather,  RNFA  . CYSTOSCOPY    . WISDOM TOOTH EXTRACTION       OB History    Gravida  1   Para  1   Term  1   Preterm      AB      Living  1     SAB      TAB      Ectopic      Multiple    0   Live Births  1           Family History  Problem Relation Age of Onset  . Diabetes Mother     Social History   Tobacco Use  . Smoking status: Never Smoker  . Smokeless tobacco: Never Used  Vaping Use  . Vaping Use: Never used  Substance Use Topics  . Alcohol use: Not Currently  . Drug use: Never    Home Medications Prior to Admission medications   Medication Sig Start Date End Date Taking? Authorizing Provider  acetaminophen (TYLENOL) 325 MG tablet Take 2 tablets (650 mg total) by mouth every 6 (six) hours as needed for mild pain (temperature > 101.5.). 08/01/20   Harold Hedge, MD  clindamycin (CLEOCIN) 300 MG capsule Take 300 mg by mouth every 6 (six) hours. 09/21/20   [provider]  ibuprofen (ADVIL) 800 MG tablet Take 1 tablet (800 mg total) by mouth every 6 (six) hours as needed. 08/01/20   Harold Hedge, MD  Prenatal Vit-Fe Fumarate-FA (PRENATAL PO) Take 1 tablet by mouth every evening.    [provider]    Allergies    Patient has no known allergies.  Review of Systems   Review of Systems  Constitutional: Positive for  chills and fever.  HENT: Negative for congestion, tinnitus, trouble swallowing and voice change.   Eyes: Negative for visual disturbance.  Respiratory: Negative for cough and shortness of breath.   Cardiovascular: Negative for chest pain.  Gastrointestinal: Negative for abdominal pain, diarrhea, nausea and vomiting.  Genitourinary: Negative for enuresis, flank pain and frequency.  Musculoskeletal: Negative for back pain.  Skin: Negative for rash.  Neurological: Negative for dizziness and headaches.  Hematological: Does not bruise/bleed easily.    Physical Exam Updated Vital Signs BP 127/83   Pulse 95   Temp 100.1 F (37.8 C) (Oral)   Resp 18   SpO2 100%   Breastfeeding Yes   Physical Exam Vitals and nursing note reviewed. Exam conducted with a chaperone present.  Constitutional:      General: She is not  in acute distress.    Appearance: She is not ill-appearing.  HENT:     Head: Normocephalic and atraumatic.     Nose: No congestion.     Mouth/Throat:     Mouth: Mucous membranes are moist.     Pharynx: Oropharynx is clear.  Eyes:     General: No scleral icterus. Cardiovascular:     Rate and Rhythm: Normal rate and regular rhythm.     Pulses: Normal pulses.     Heart sounds: No murmur heard.  No friction rub. No gallop.   Pulmonary:     Effort: No respiratory distress.     Breath sounds: No wheezing, rhonchi or rales.  Abdominal:     General: There is no distension.     Tenderness: There is no abdominal tenderness. There is no guarding.  Musculoskeletal:        General: No swelling.  Skin:    General: Skin is warm and dry.     Coloration: Skin is not jaundiced.     Findings: Erythema present. No bruising, lesion or rash.     Comments: With chaperone present left breast was visualized there was a erythematous area on the 3 o'clock position lateral aspect of the left breast.  No drainage or purulent discharge noted.  It was tender to palpation, no fluctuance present but induration felt.  Neurological:     Mental Status: She is alert.  Psychiatric:        Mood and Affect: Mood normal.     ED Results / Procedures / Treatments   Labs (all labs ordered are listed, but only abnormal results are displayed) Labs Reviewed  CBC WITH DIFFERENTIAL/PLATELET - Abnormal; Notable for the following components:      Result Value   WBC 15.7 (*)    RBC 3.61 (*)    Hemoglobin 11.8 (*)    HCT 34.9 (*)    Neutro Abs 12.7 (*)    Monocytes Absolute 1.3 (*)    Abs Immature Granulocytes 0.10 (*)    All other components within normal limits    EKG None  Radiology No results found.  Procedures Procedures (including critical care time)  Medications Ordered in ED Medications - No data to display  ED Course  I have reviewed the triage vital signs and the nursing notes.  Pertinent labs  & imaging results that were available during my care of the patient were reviewed by me and considered in my medical decision making (see chart for details).    MDM Rules/Calculators/A&P  Patient presents chief complaint of left breast pain.  She is alert, did not appear in acute distress, vital signs reassuring.  Will order CBC for further evaluation.  CBC shows leukocytosis of 15.7 which appears to be slightly above baseline as last month was 12.2, normocytic anemia which appears to be a baseline for patient.  I have low suspicion patient suffering from a breast abscess as area was palpated no fluctuance present area has noted indurated.  Low suspicion for sepsis as she is nontachycardic, afebrile, nontoxic-appearing.  Patient does have an elevated white count which appears to be slightly elevated from one month ago and expected as patient has a local infection and started on antiboitc less than 24 hours ago.  Low suspicion for malignancy as patient denies unexplained weight loss, night sweats, bone pain, history is more consistent with mastitis as she is currently breast-feeding.  I suspect patient suffering from acute mastitis will encourage her to continue with her antibiotics as she just started them yesterday and will encourage her to follow-up with her OB/GYN and imaging center for further evaluation.  Vital signs remained stable, no indication for hospital admission.  Patient discussed with attending who agrees assessment plan.  Patient given at home care as well strict return precautions.  Patient verbalized that she understood and agreed to said plan. Final Clinical Impression(s) / ED Diagnoses Final diagnoses:  None    Rx / DC Orders ED Discharge Orders    None       Carroll Sage, PA-C 09/22/20 1610    Virgina Norfolk, DO 09/23/20 0725

## 2020-10-08 ENCOUNTER — Other Ambulatory Visit: Payer: Self-pay | Admitting: Obstetrics and Gynecology

## 2020-10-08 DIAGNOSIS — N6320 Unspecified lump in the left breast, unspecified quadrant: Secondary | ICD-10-CM

## 2020-10-09 ENCOUNTER — Other Ambulatory Visit: Payer: Self-pay

## 2020-10-09 ENCOUNTER — Ambulatory Visit
Admission: RE | Admit: 2020-10-09 | Discharge: 2020-10-09 | Disposition: A | Discharge status: Home / Self Care | Payer: BC Managed Care – PPO | Source: Ambulatory Visit | Attending: Obstetrics and Gynecology | Admitting: Obstetrics and Gynecology

## 2020-10-09 DIAGNOSIS — N6320 Unspecified lump in the left breast, unspecified quadrant: Secondary | ICD-10-CM

## 2020-10-10 ENCOUNTER — Other Ambulatory Visit: Payer: Self-pay | Admitting: Obstetrics and Gynecology

## 2020-10-10 ENCOUNTER — Ambulatory Visit
Admission: RE | Admit: 2020-10-10 | Discharge: 2020-10-10 | Disposition: A | Discharge status: Home / Self Care | Payer: BC Managed Care – PPO | Source: Ambulatory Visit | Attending: Obstetrics and Gynecology | Admitting: Obstetrics and Gynecology

## 2020-10-10 DIAGNOSIS — N6320 Unspecified lump in the left breast, unspecified quadrant: Secondary | ICD-10-CM

## 2020-10-15 LAB — AEROBIC/ANAEROBIC CULTURE W GRAM STAIN (SURGICAL/DEEP WOUND)

## 2020-10-18 ENCOUNTER — Ambulatory Visit
Admission: RE | Admit: 2020-10-18 | Discharge: 2020-10-18 | Disposition: A | Discharge status: Home / Self Care | Payer: BC Managed Care – PPO | Source: Ambulatory Visit | Attending: Obstetrics and Gynecology | Admitting: Obstetrics and Gynecology

## 2020-10-18 ENCOUNTER — Other Ambulatory Visit: Payer: Self-pay | Admitting: Obstetrics and Gynecology

## 2020-10-18 ENCOUNTER — Other Ambulatory Visit: Payer: Self-pay

## 2020-10-18 DIAGNOSIS — N6320 Unspecified lump in the left breast, unspecified quadrant: Secondary | ICD-10-CM

## 2020-10-25 ENCOUNTER — Ambulatory Visit
Admission: RE | Admit: 2020-10-25 | Discharge: 2020-10-25 | Disposition: A | Discharge status: Home / Self Care | Payer: BC Managed Care – PPO | Source: Ambulatory Visit | Attending: Obstetrics and Gynecology | Admitting: Obstetrics and Gynecology

## 2020-10-25 ENCOUNTER — Other Ambulatory Visit: Payer: Self-pay | Admitting: Obstetrics and Gynecology

## 2020-10-25 ENCOUNTER — Other Ambulatory Visit: Payer: Self-pay

## 2020-10-25 DIAGNOSIS — N6320 Unspecified lump in the left breast, unspecified quadrant: Secondary | ICD-10-CM

## 2020-10-29 ENCOUNTER — Other Ambulatory Visit: Payer: Self-pay | Admitting: Obstetrics and Gynecology

## 2020-10-29 DIAGNOSIS — L0291 Cutaneous abscess, unspecified: Secondary | ICD-10-CM

## 2020-10-30 ENCOUNTER — Ambulatory Visit
Admission: RE | Admit: 2020-10-30 | Discharge: 2020-10-30 | Disposition: A | Discharge status: Home / Self Care | Payer: BC Managed Care – PPO | Source: Ambulatory Visit | Attending: Obstetrics and Gynecology | Admitting: Obstetrics and Gynecology

## 2020-10-30 ENCOUNTER — Other Ambulatory Visit: Payer: Self-pay | Admitting: Obstetrics and Gynecology

## 2020-10-30 ENCOUNTER — Other Ambulatory Visit: Payer: Self-pay

## 2020-10-30 DIAGNOSIS — L0291 Cutaneous abscess, unspecified: Secondary | ICD-10-CM

## 2020-10-30 DIAGNOSIS — N611 Abscess of the breast and nipple: Secondary | ICD-10-CM

## 2020-11-04 LAB — AEROBIC/ANAEROBIC CULTURE W GRAM STAIN (SURGICAL/DEEP WOUND)

## 2020-11-20 ENCOUNTER — Other Ambulatory Visit: Payer: Self-pay | Admitting: Obstetrics and Gynecology

## 2020-11-20 ENCOUNTER — Ambulatory Visit
Admission: RE | Admit: 2020-11-20 | Discharge: 2020-11-20 | Disposition: A | Discharge status: Home / Self Care | Payer: BC Managed Care – PPO | Source: Ambulatory Visit | Attending: Obstetrics and Gynecology | Admitting: Obstetrics and Gynecology

## 2020-11-20 ENCOUNTER — Other Ambulatory Visit: Payer: Self-pay

## 2020-11-20 DIAGNOSIS — N6320 Unspecified lump in the left breast, unspecified quadrant: Secondary | ICD-10-CM

## 2020-11-20 DIAGNOSIS — N611 Abscess of the breast and nipple: Secondary | ICD-10-CM

## 2020-11-21 ENCOUNTER — Other Ambulatory Visit: Payer: Self-pay | Admitting: Obstetrics and Gynecology

## 2020-11-21 DIAGNOSIS — N6320 Unspecified lump in the left breast, unspecified quadrant: Secondary | ICD-10-CM

## 2020-12-06 ENCOUNTER — Ambulatory Visit
Admission: RE | Admit: 2020-12-06 | Discharge: 2020-12-06 | Disposition: A | Discharge status: Home / Self Care | Payer: BC Managed Care – PPO | Source: Ambulatory Visit | Attending: Obstetrics and Gynecology | Admitting: Obstetrics and Gynecology

## 2020-12-06 ENCOUNTER — Other Ambulatory Visit: Payer: Self-pay

## 2020-12-06 DIAGNOSIS — N6320 Unspecified lump in the left breast, unspecified quadrant: Secondary | ICD-10-CM

## 2020-12-21 ENCOUNTER — Other Ambulatory Visit: Payer: Self-pay | Admitting: Nurse Practitioner

## 2020-12-21 ENCOUNTER — Other Ambulatory Visit: Payer: Self-pay | Admitting: Family Medicine

## 2020-12-21 ENCOUNTER — Other Ambulatory Visit: Payer: Self-pay | Admitting: Obstetrics & Gynecology

## 2020-12-25 ENCOUNTER — Other Ambulatory Visit: Payer: BC Managed Care – PPO

## 2021-03-04 IMAGING — US US BREAST*L* LIMITED INC AXILLA
1 series · 9 of 9 positions shown · non-contrast
Comparison: Previous exam(s).

CLINICAL DATA: The patient presented with signs mastitis October 09, 2000. A thick collection was identified with ultrasound. The
collection was drained to completion October 10, 2020. The patient
was placed on Keflex and her last dose is today. Symptoms of
infection have resolved.

EXAM:
ULTRASOUND OF THE LEFT BREAST

[Series 1: us breast*left* limited inc axilla · 0.07mm/px · 9 of 9 slices shown]
[im 1/9]
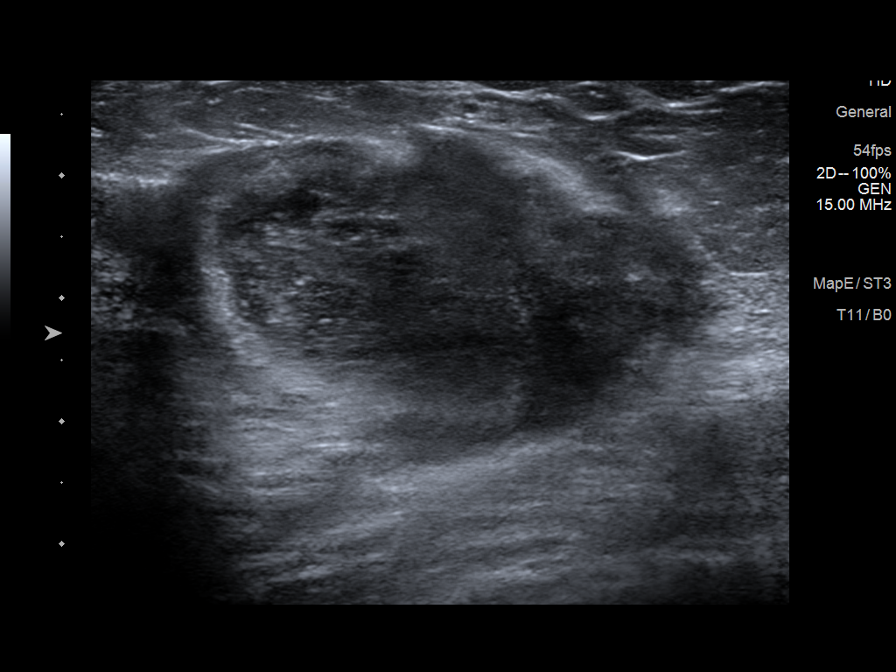
[im 2/9]
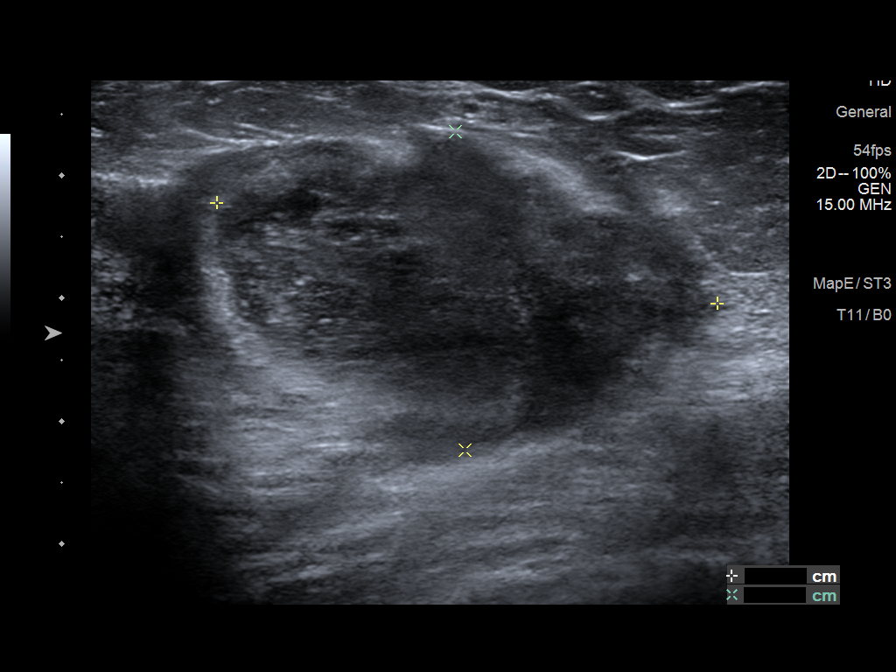
[im 3/9]
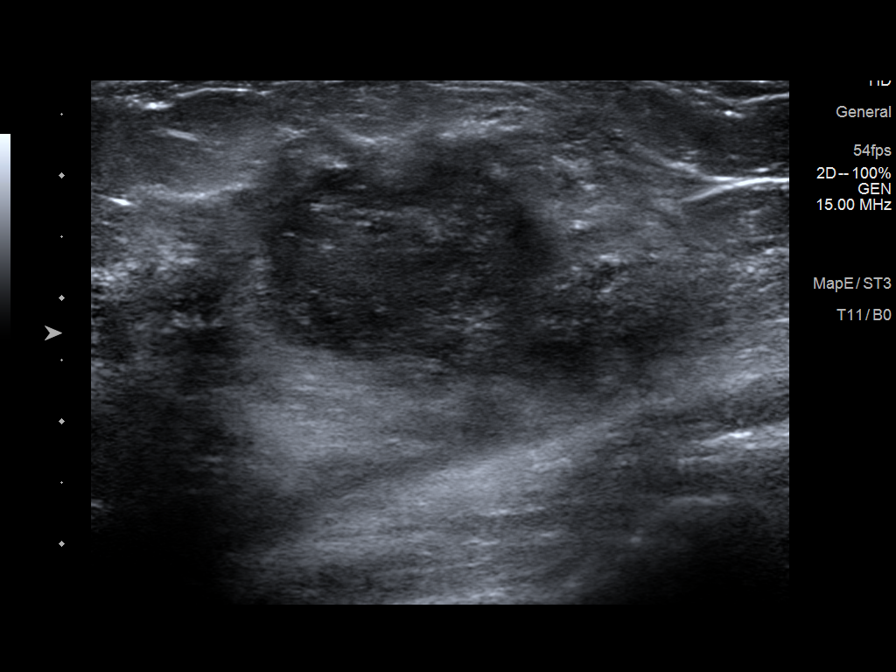
[im 4/9]
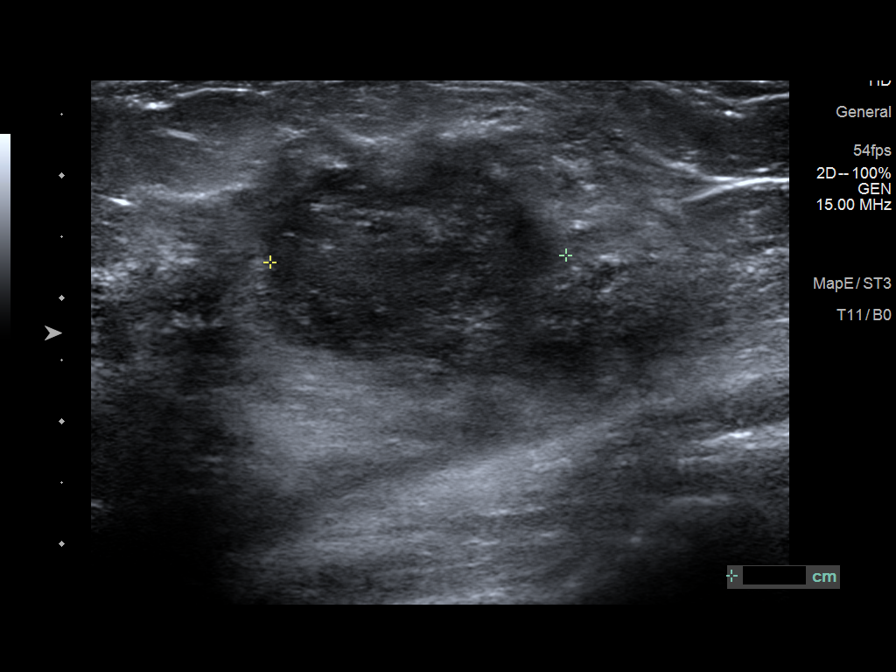
[im 5/9]
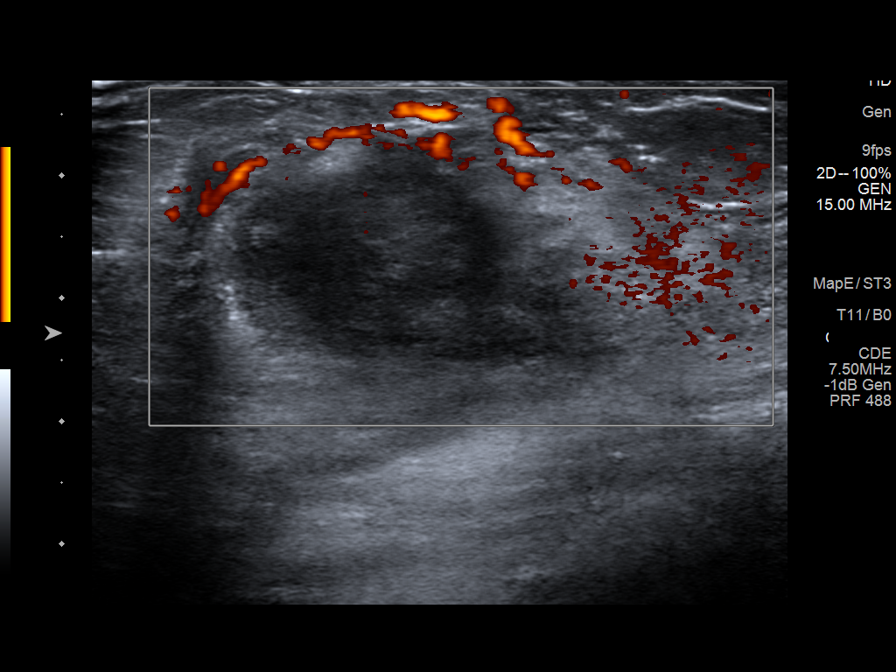
[im 6/9]
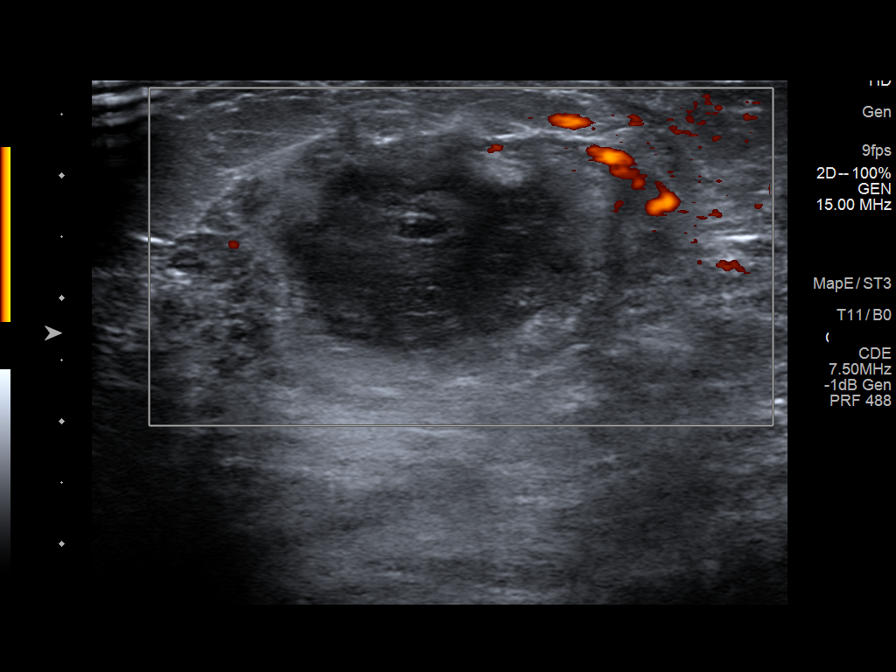
[im 7/9]
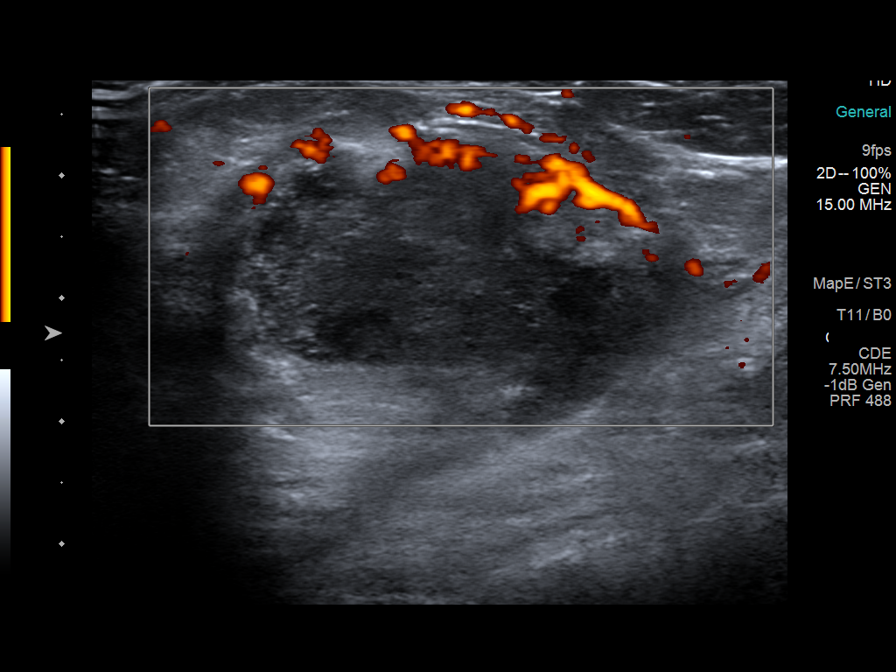
[im 8/9]
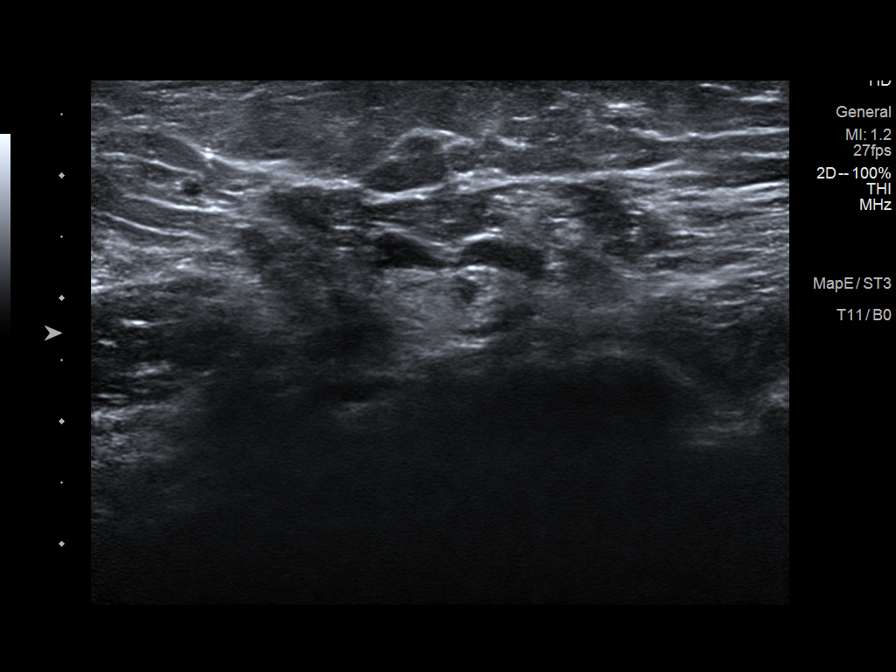
[im 9/9]
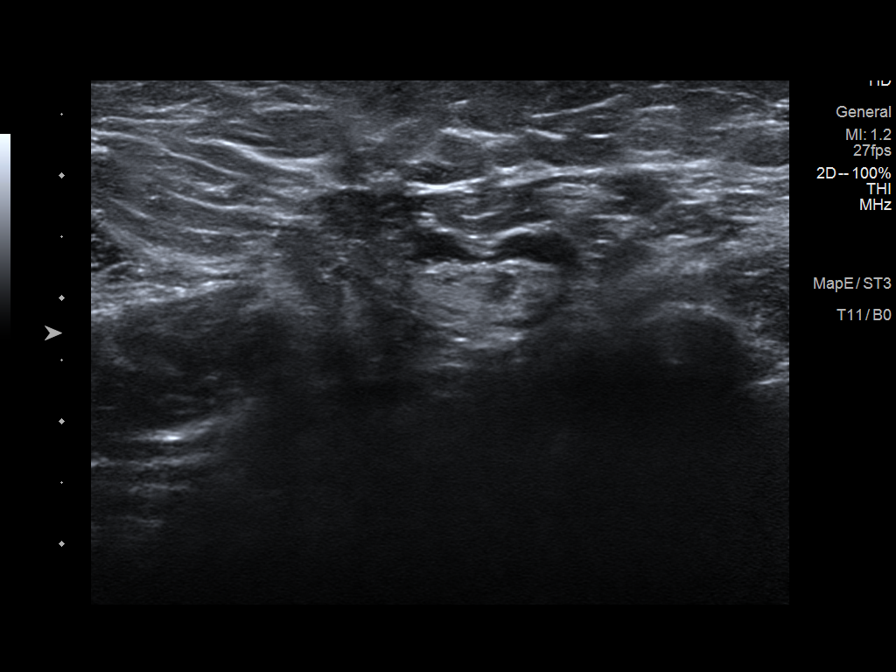

[9 of 9 positions shown; findings below may reference images not displayed]

FINDINGS: On physical exam, there is no erythema or warmth identified.

Targeted ultrasound is performed, showing re-accumulation of the
previous collection at 3 o'clock, 3 cm from the nipple measuring
x 2.6 x 2.4 cm today versus 5.1 by 2.7 by 2.4 cm previously.
IMPRESSION: The previously aspirated collection has reaccumulated. There appear
to be milk within the collection at the time of previous aspiration.
Microbiology demonstrated a few white blood cells. The collection
may represent a galactocele. The patient finished breast feeding 3
or 4 weeks ago.

RECOMMENDATION:
The patient finishes her course of antibiotics today. There are
currently no signs of infection. Recommend follow-up ultrasound with
potential drainage if clinically warranted in 1 week. The patient
was also given another script of Keflex to be filled and taken only
if her signs of infection return.

I have discussed the findings and recommendations with the patient.
If applicable, a reminder letter will be sent to the patient
regarding the next appointment.

BI-RADS CATEGORY  3: Probably benign.

## 2021-03-11 IMAGING — US US BREAST*L* LIMITED INC AXILLA
1 series · 6 of 6 positions shown · non-contrast
Comparison: Previous exam(s).

CLINICAL DATA: 29-year-old who completed breast feeding
approximately 4 weeks ago. The patient was diagnosed with an abscess
or an infected galactocele in the OUTER periareolar LEFT breast on
10/09/2020 for which she underwent ultrasound-guided aspiration and
subsequent oral antibiotic therapy with Keflex with resolution of
symptoms. A follow-up ultrasound on 10/18/2020 showed reaccumulation
of the collection, but the skin erythema/cellulitis had resolved at
that time.

She returns today for short-term interval follow-up. She denies
recurrent symptoms of infection in the LEFT breast.
EXAM:
ULTRASOUND OF THE LEFT BREAST

[Series 1: us breast*left* limited inc axilla · 0.07mm/px · 6 of 6 slices shown]
[im 1/6]
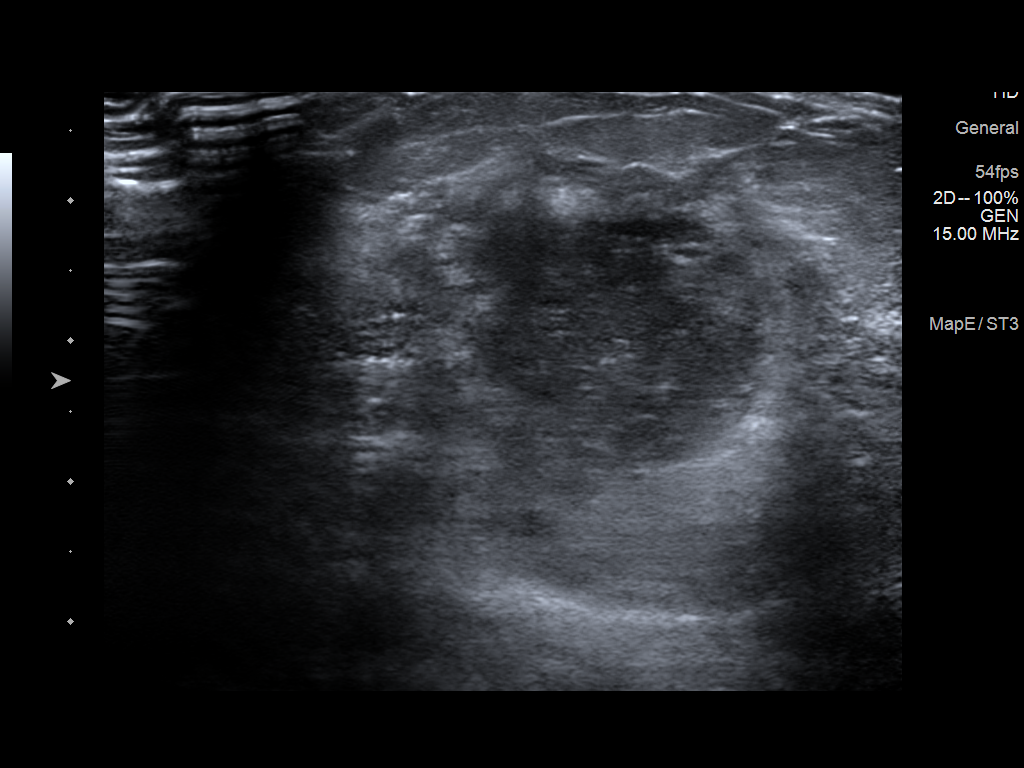
[im 2/6]
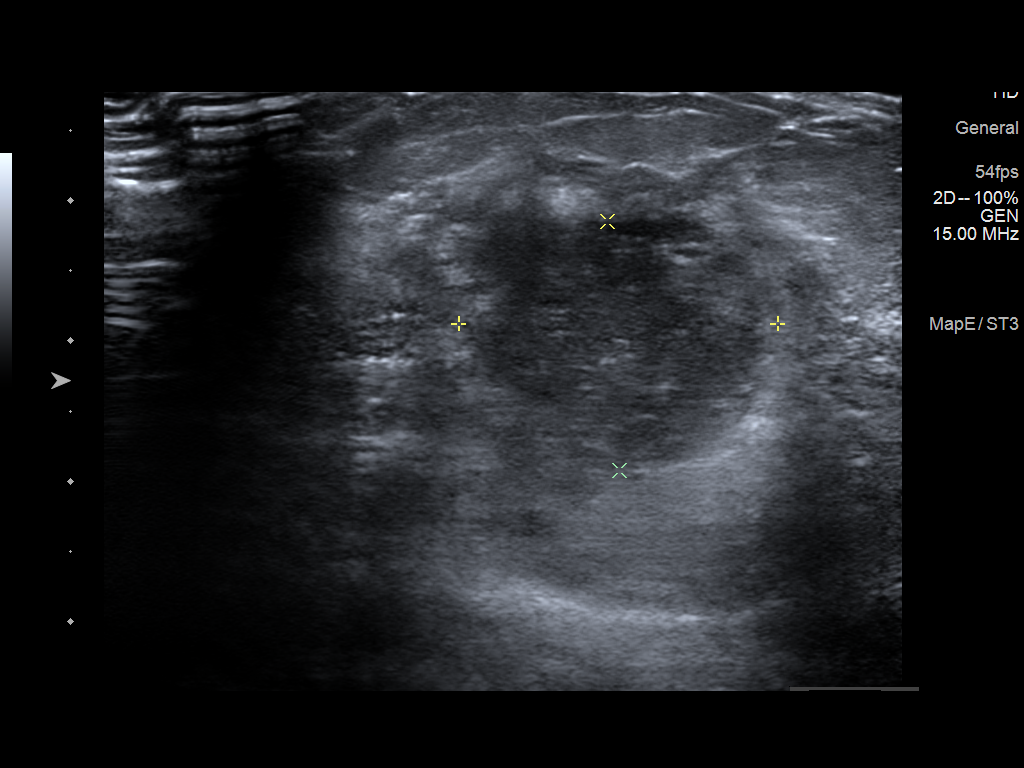
[im 3/6]
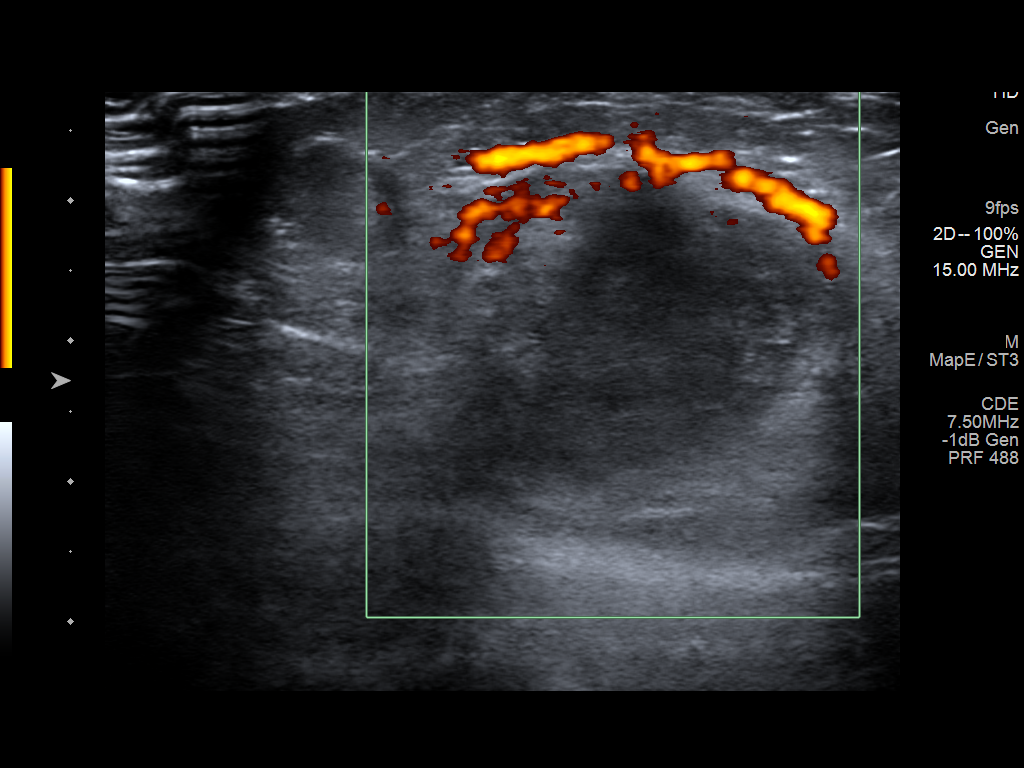
[im 4/6]
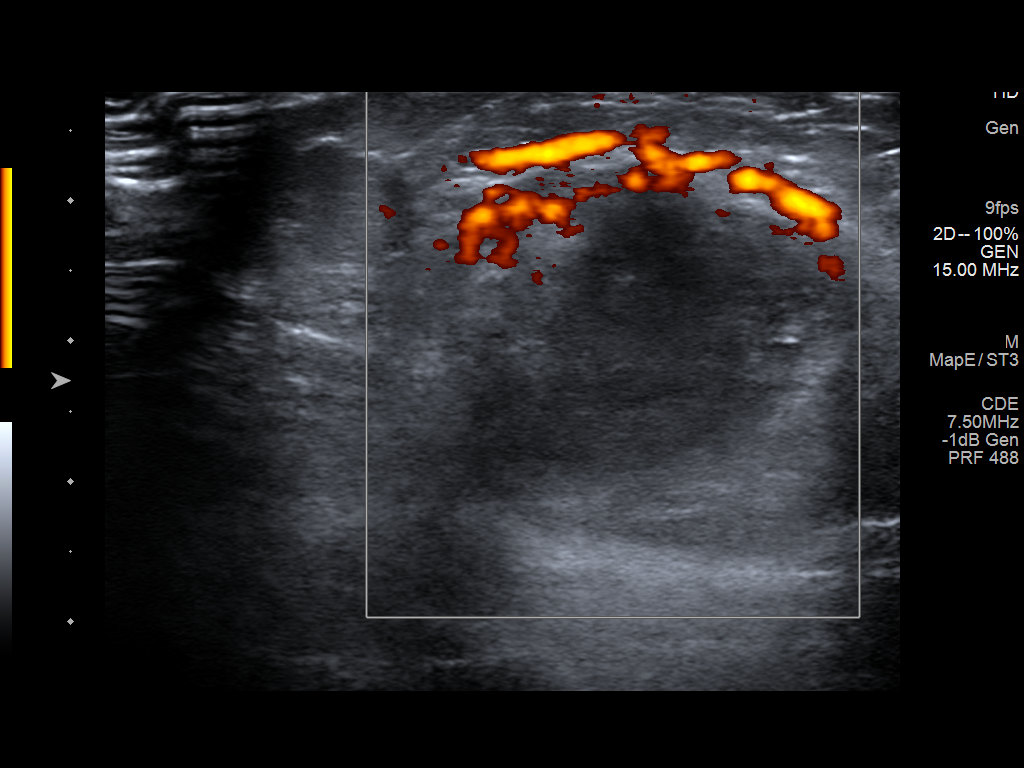
[im 5/6]
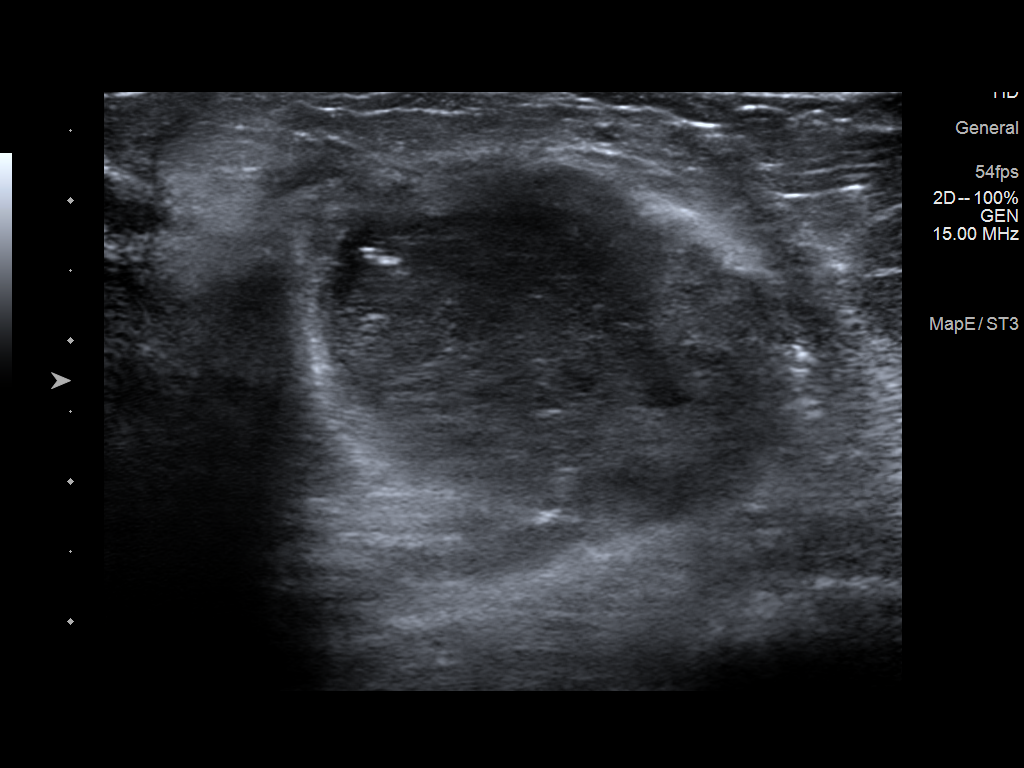
[im 6/6]
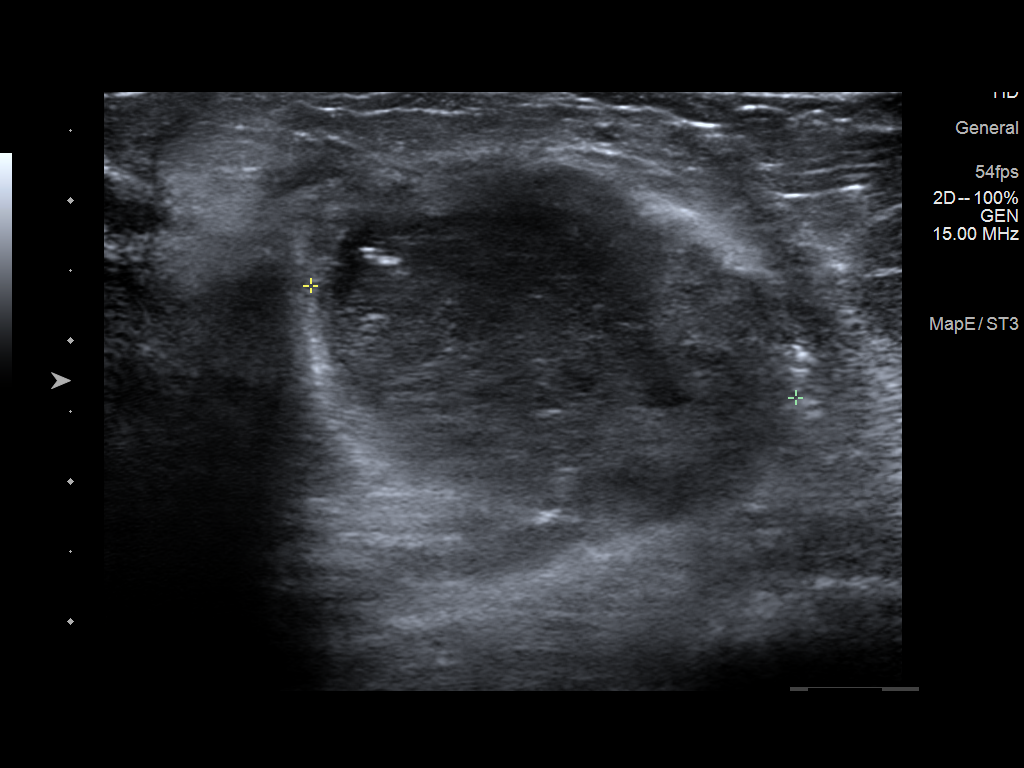

[6 of 6 positions shown; findings below may reference images not displayed]

FINDINGS: On correlative physical exam, there is a freely mobile palpable
approximate 3 cm mass in the OUTER periareolar LEFT breast. There is
no evidence of cellulitis at this time.

Targeted ultrasound is performed, showing that the complicated fluid
collection at the 3 o'clock position approximately 2 cm from the
nipple has significantly decreased in size since the ultrasound 1
week ago, currently measuring approximately 3.5 x 1.8 x 2.3 cm (4.2
x 2.6 x 2.4 cm on the 10/18/2020 ultrasound). Mild hyperemia is
present at the anterior margin of the fluid collection on power
Doppler evaluation, there is no internal power Doppler flow.
IMPRESSION: Interval significant decrease in size of the complicated fluid
collection in the OUTER periareolar LEFT breast since the ultrasound
1 week ago without current symptoms of infection.

RECOMMENDATION:
As there are no current symptoms of infection and since the
complicated fluid collection has significantly decreased in size in
1 week, I do not feel that aspiration is necessary at this time. The
patient has been scheduled for follow-up ultrasound in 2 months (at
which time she will be 3 months removed from breast feeding).

I have discussed the findings and recommendations with the patient.
If applicable, a reminder letter will be sent to the patient
regarding the next appointment.

BI-RADS CATEGORY  3: Probably benign.

## 2021-03-15 ENCOUNTER — Other Ambulatory Visit: Payer: Self-pay

## 2021-04-06 IMAGING — MG MM BREAST LOCALIZATION CLIP
4 series · 4 of 12 positions shown · non-contrast
Comparison: Previous exam(s).

CLINICAL DATA: Patient status post ultrasound-guided biopsy left
breast mass.

EXAM:
DIAGNOSTIC LEFT MAMMOGRAM POST ULTRASOUND BIOPSY

[L ML synth-2D]
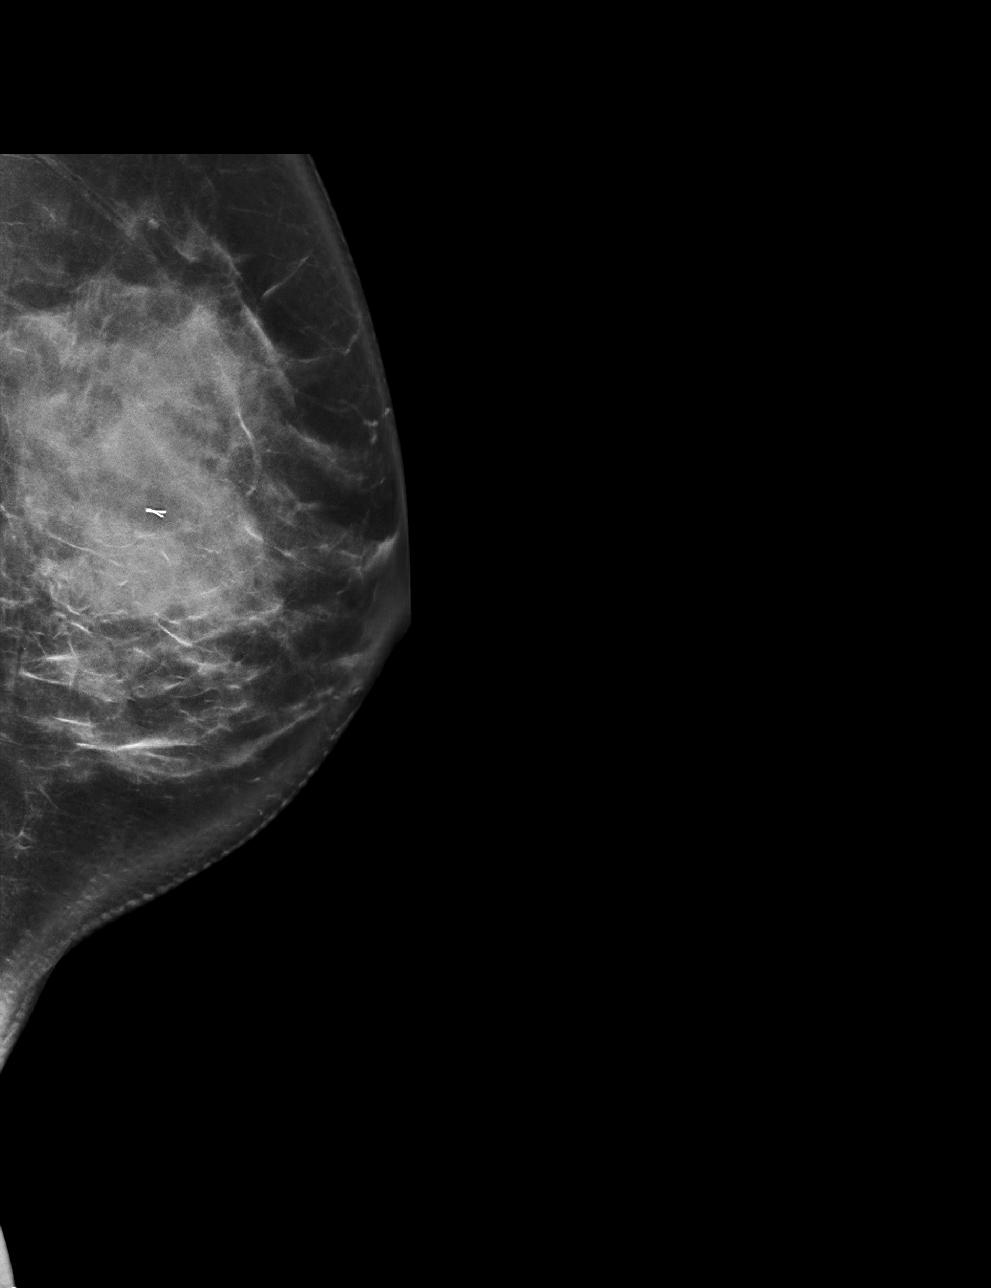

[L CC synth-2D]
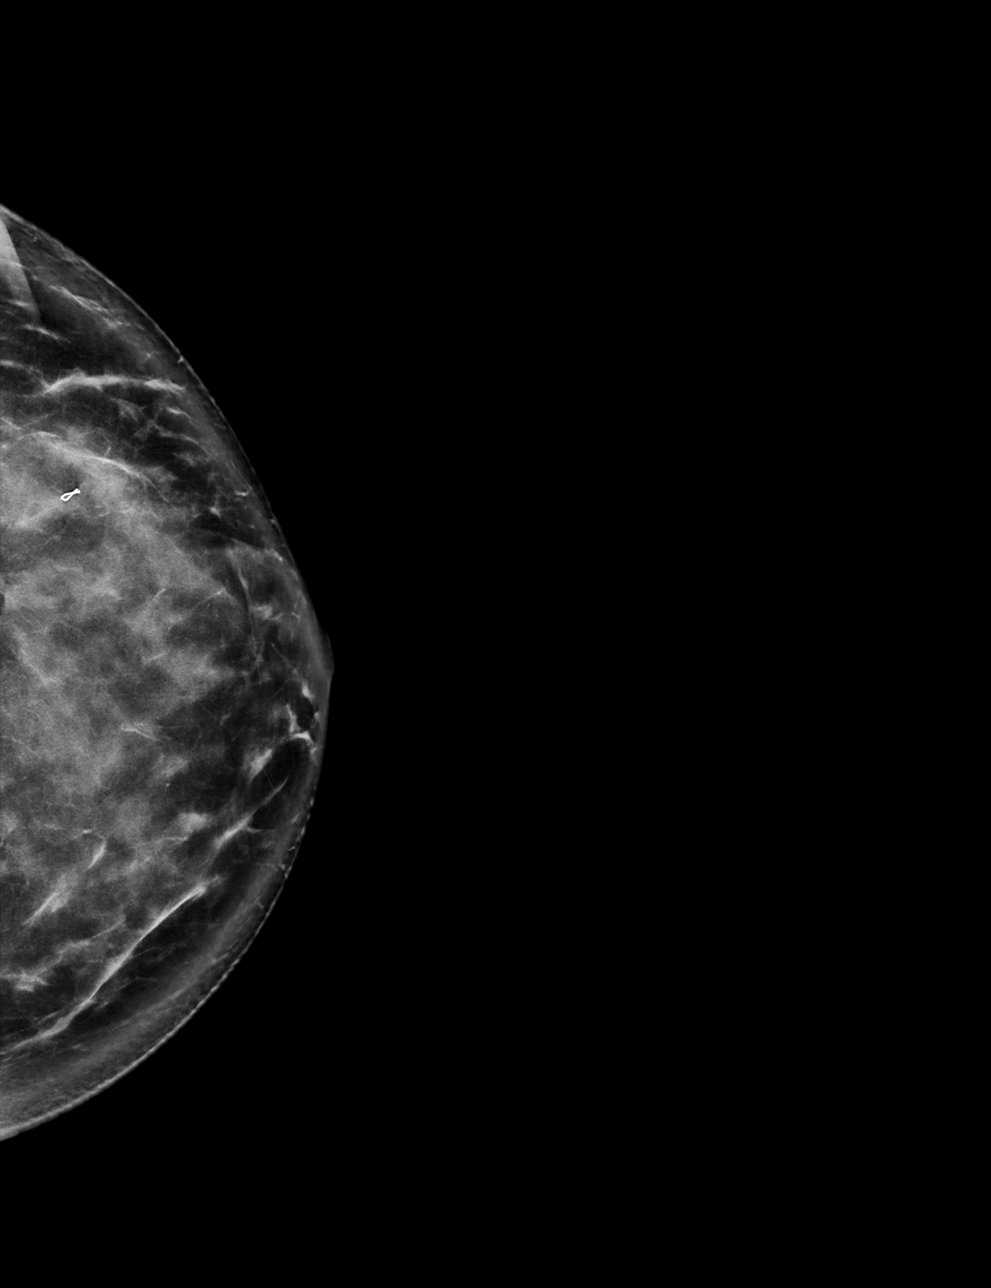

[L ML tomo · tomo slice 41/80.0]
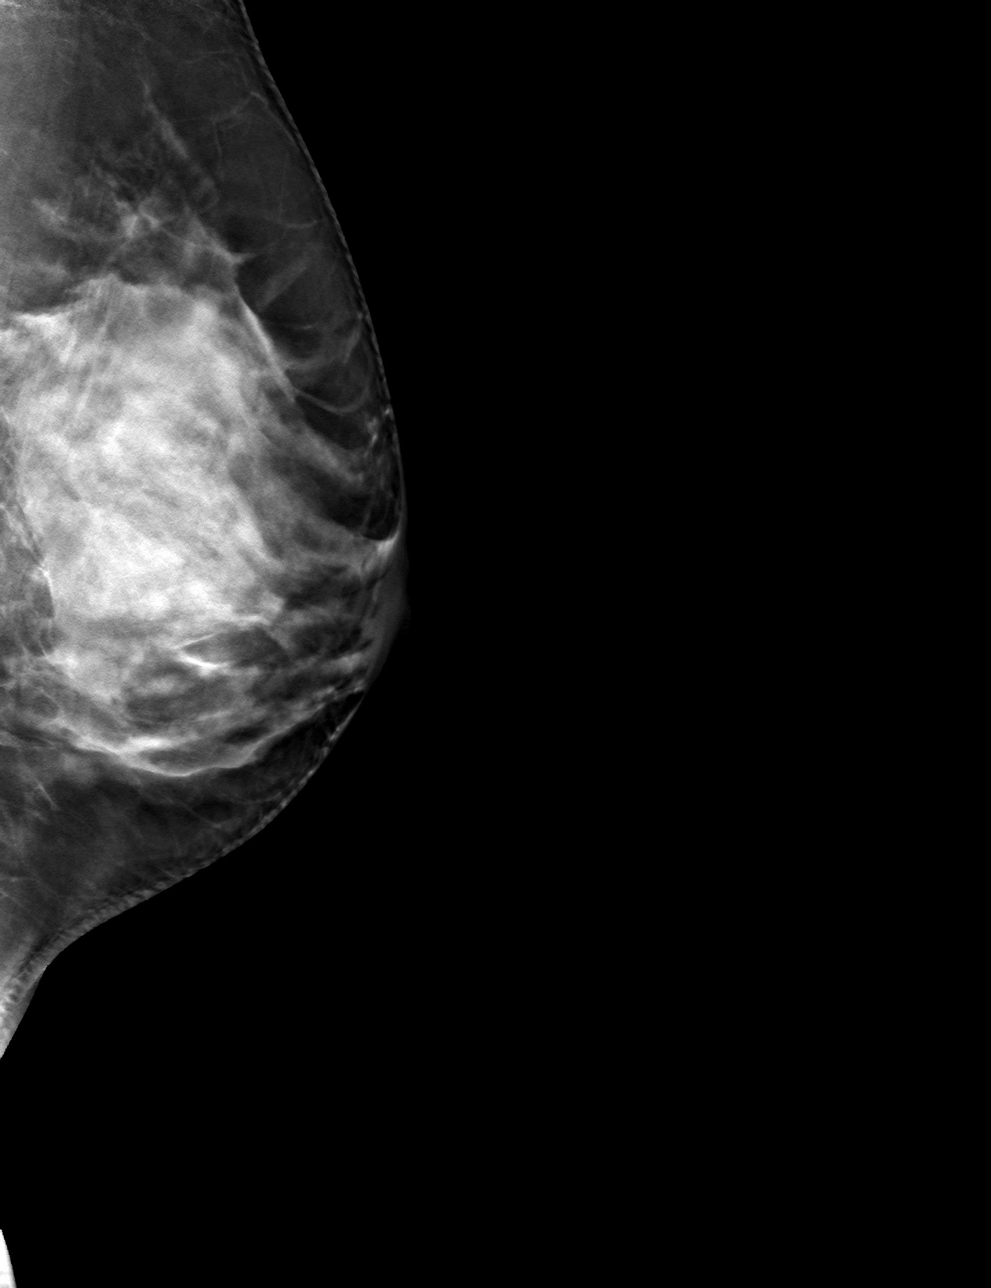

[L CC tomo · tomo slice 41/80.0]
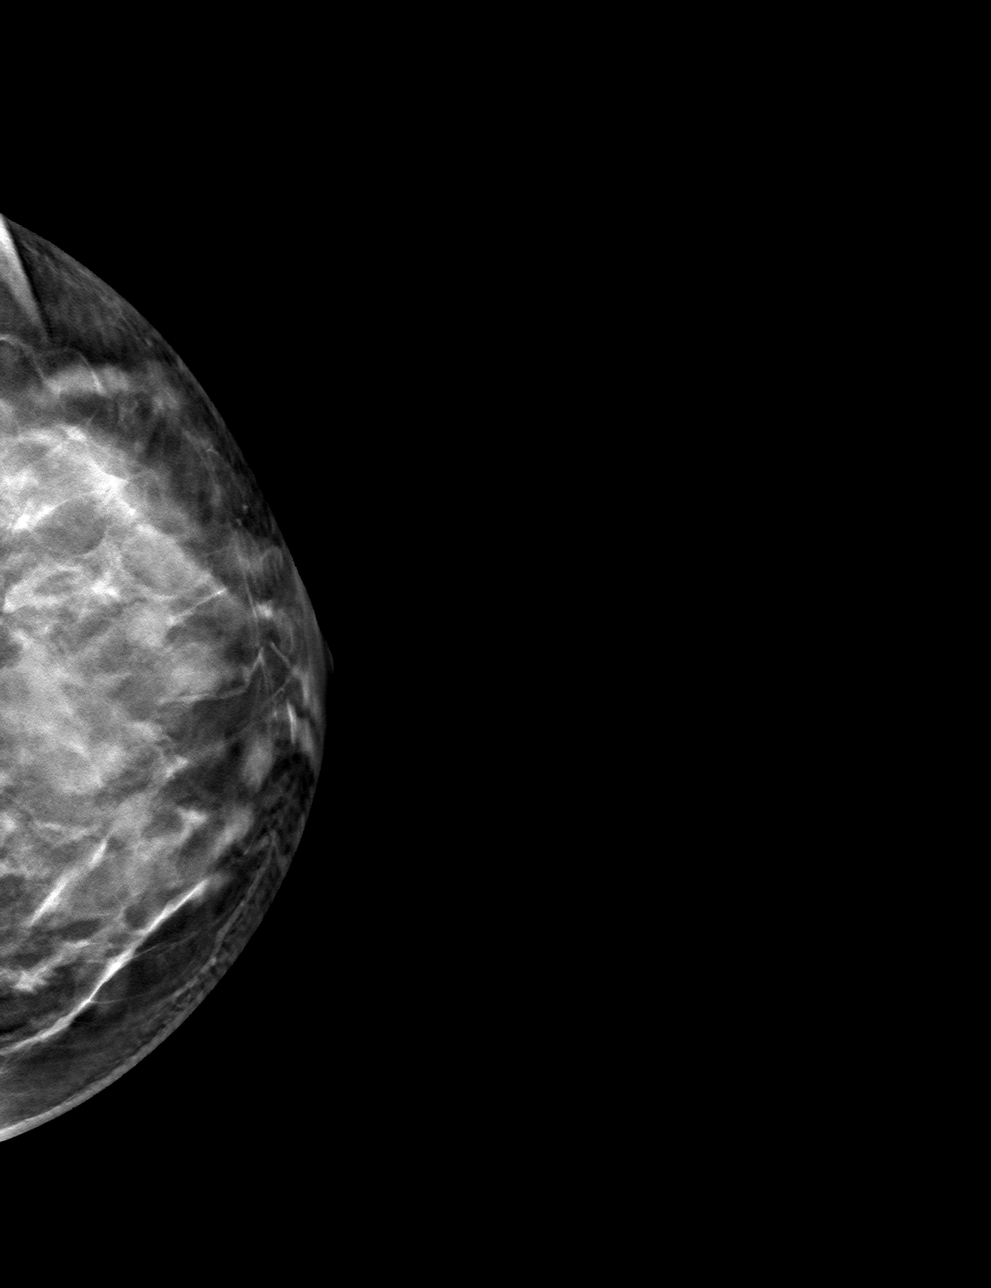

[4 of 12 positions shown; findings below may reference images not displayed]

FINDINGS: Mammographic images were obtained following ultrasound guided biopsy
of left breast mass 3 o'clock position. The biopsy marking clip is
in expected position at the site of biopsy.
IMPRESSION: Appropriate positioning of the ribbon shaped biopsy marking clip at
the site of biopsy in the left breast 3 o'clock position.

Final Assessment: Post Procedure Mammograms for Marker Placement

## 2021-05-13 ENCOUNTER — Other Ambulatory Visit: Payer: Self-pay

## 2021-05-17 ENCOUNTER — Other Ambulatory Visit: Payer: Self-pay

## 2021-05-17 ENCOUNTER — Ambulatory Visit
Admission: RE | Admit: 2021-05-17 | Discharge: 2021-05-17 | Disposition: A | Discharge status: Home / Self Care | Payer: BC Managed Care – PPO | Source: Ambulatory Visit | Attending: Obstetrics and Gynecology | Admitting: Obstetrics and Gynecology

## 2021-05-17 DIAGNOSIS — N6320 Unspecified lump in the left breast, unspecified quadrant: Secondary | ICD-10-CM

## 2022-07-14 LAB — OB RESULTS CONSOLE RUBELLA ANTIBODY, IGM: Rubella: IMMUNE

## 2022-07-14 LAB — OB RESULTS CONSOLE ABO/RH: RH Type: NEGATIVE

## 2022-07-14 LAB — OB RESULTS CONSOLE HIV ANTIBODY (ROUTINE TESTING): HIV: NONREACTIVE

## 2022-07-14 LAB — OB RESULTS CONSOLE HEPATITIS B SURFACE ANTIGEN: Hepatitis B Surface Ag: NEGATIVE

## 2022-07-14 LAB — HEPATITIS C ANTIBODY: HCV Ab: NEGATIVE

## 2022-07-14 LAB — OB RESULTS CONSOLE RPR: RPR: NONREACTIVE

## 2022-07-14 LAB — OB RESULTS CONSOLE ANTIBODY SCREEN: Antibody Screen: NEGATIVE

## 2022-07-25 LAB — OB RESULTS CONSOLE GC/CHLAMYDIA
Chlamydia: NEGATIVE
Neisseria Gonorrhea: NEGATIVE

## 2022-11-13 ENCOUNTER — Inpatient Hospital Stay (HOSPITAL_BASED_OUTPATIENT_CLINIC_OR_DEPARTMENT_OTHER): Payer: BC Managed Care – PPO

## 2022-11-13 ENCOUNTER — Inpatient Hospital Stay (HOSPITAL_COMMUNITY)
Admission: AD | Admit: 2022-11-13 | Discharge: 2022-11-13 | Disposition: A | Discharge status: Home / Self Care | Payer: BC Managed Care – PPO | Attending: Obstetrics and Gynecology | Admitting: Obstetrics and Gynecology

## 2022-11-13 ENCOUNTER — Encounter (HOSPITAL_COMMUNITY): Payer: Self-pay | Admitting: *Deleted

## 2022-11-13 ENCOUNTER — Inpatient Hospital Stay (HOSPITAL_COMMUNITY): Payer: BC Managed Care – PPO

## 2022-11-13 DIAGNOSIS — N760 Acute vaginitis: Secondary | ICD-10-CM

## 2022-11-13 DIAGNOSIS — R109 Unspecified abdominal pain: Secondary | ICD-10-CM

## 2022-11-13 DIAGNOSIS — B9689 Other specified bacterial agents as the cause of diseases classified elsewhere: Secondary | ICD-10-CM

## 2022-11-13 DIAGNOSIS — O26892 Other specified pregnancy related conditions, second trimester: Secondary | ICD-10-CM | POA: Diagnosis not present

## 2022-11-13 DIAGNOSIS — O23592 Infection of other part of genital tract in pregnancy, second trimester: Secondary | ICD-10-CM | POA: Diagnosis present

## 2022-11-13 DIAGNOSIS — O26899 Other specified pregnancy related conditions, unspecified trimester: Secondary | ICD-10-CM | POA: Diagnosis not present

## 2022-11-13 DIAGNOSIS — Z3A26 26 weeks gestation of pregnancy: Secondary | ICD-10-CM | POA: Diagnosis not present

## 2022-11-13 DIAGNOSIS — O34219 Maternal care for unspecified type scar from previous cesarean delivery: Secondary | ICD-10-CM | POA: Diagnosis not present

## 2022-11-13 DIAGNOSIS — O321XX0 Maternal care for breech presentation, not applicable or unspecified: Secondary | ICD-10-CM | POA: Diagnosis not present

## 2022-11-13 LAB — LIPASE, BLOOD: Lipase: 34 U/L (ref 11–51)

## 2022-11-13 LAB — WET PREP, GENITAL
Sperm: NONE SEEN
Trich, Wet Prep: NONE SEEN
WBC, Wet Prep HPF POC: >=10 — AB (ref <10)
Yeast Wet Prep HPF POC: NONE SEEN

## 2022-11-13 LAB — URINALYSIS, ROUTINE W REFLEX MICROSCOPIC
Bilirubin Urine: NEGATIVE
Glucose, UA: NEGATIVE mg/dL
Hgb urine dipstick: NEGATIVE
Ketones, ur: NEGATIVE mg/dL
Leukocytes,Ua: NEGATIVE
Nitrite: NEGATIVE
Protein, ur: NEGATIVE mg/dL
Specific Gravity, Urine: 1.004 — ABNORMAL LOW (ref 1.005–1.030)
pH: 7 (ref 5.0–8.0)

## 2022-11-13 LAB — COMPREHENSIVE METABOLIC PANEL
ALT: 13 U/L (ref 0–44)
AST: 16 U/L (ref 15–41)
Albumin: 3.2 g/dL — ABNORMAL LOW (ref 3.5–5.0)
Alkaline Phosphatase: 55 U/L (ref 38–126)
Anion gap: 9 (ref 5–15)
BUN: 7 mg/dL (ref 6–20)
CO2: 21 mmol/L — ABNORMAL LOW (ref 22–32)
Calcium: 8.7 mg/dL — ABNORMAL LOW (ref 8.9–10.3)
Chloride: 105 mmol/L (ref 98–111)
Creatinine, Ser: 0.58 mg/dL (ref 0.44–1.00)
GFR, Estimated: >60 mL/min (ref >60)
Glucose, Bld: 86 mg/dL (ref 70–99)
Potassium: 3.3 mmol/L — ABNORMAL LOW (ref 3.5–5.1)
Sodium: 135 mmol/L (ref 135–145)
Total Bilirubin: 0.4 mg/dL (ref 0.3–1.2)
Total Protein: 5.7 g/dL — ABNORMAL LOW (ref 6.5–8.1)

## 2022-11-13 LAB — PROTEIN / CREATININE RATIO, URINE
Creatinine, Urine: 25 mg/dL
Total Protein, Urine: <6 mg/dL

## 2022-11-13 LAB — AMYLASE: Amylase: 72 U/L (ref 28–100)

## 2022-11-13 MED ORDER — CAPSAICIN 0.025 % EX CREA: TOPICAL_CREAM | Freq: Two times a day (BID) | CUTANEOUS | 0 refills | Status: DC

## 2022-11-13 MED ORDER — CAPSAICIN 0.025 % EX CREA
TOPICAL_CREAM | Freq: Two times a day (BID) | CUTANEOUS | Status: DC
  Filled 2022-11-13: qty 60

## 2022-11-13 MED ORDER — METRONIDAZOLE 500 MG PO TABS: 500.0000 mg | ORAL_TABLET | Freq: Two times a day (BID) | ORAL | 0 refills | Status: DC

## 2022-11-13 NOTE — [facility] Note (Signed)
.  Karen Perkins is a 31 y.o. at [redacted]w[redacted]d here in [facility] reporting upper abdominal pain in mid Nov. Has happened off and on since then. Feels like a "bruise". Last few days pain starts at lunch and lasts throughout the day. Pain gets worse when she is active and moving but improves with rest. Pain is dull and achy. Also is tender to touch like when hugged or putting on seat belt. Denies VB or LOF. Reports good FM  Onset of complaint: last 3 days Pain score: 5 when present but no pain now since she has been off her feet.  Vitals:   11/13/22 1922 11/13/22 1924  BP:  126/61  Pulse: 77   Resp: 17   Temp: 98.1 F (36.7 C)   SpO2: 100%      FHT:142 Lab orders placed from triage:  none

## 2022-11-13 NOTE — MAU Provider Note (Signed)
History     CSN: [id number]  Arrival date and time: 11/13/22 4098   Event Date/Time   First Provider Initiated Contact with Patient 11/13/22 2011      Chief Complaint  Patient presents with   Abdominal Pain   Karen Perkins, a  31 y.o. G2P1001 at [redacted]w[redacted]d presents to MAU with complaints of on-going abdominal pain since Nov. Patient states she has been experiencing "Dull achy, intermittent abdominal pain that is tight and occasionally is tender to the touch when it is occurring. She states when she rests it goes away. She is not experiencing any pain at the moment but states when it is occurring she rates it a 5/10. States she attempted PO Tylenol last night for pain and it went away. She is concerned because it is occurring more often, multiple times a day. She denies vaginal bleeding, leaking of fluid, and abnormal vaginal discharge. She denies a rash, reddened area or changing soaps that may cause skin irritation. She endorses positive fetal movement. Last BM was 2 days ago. Patient patient endorses being regular up to this point. 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           OB History     Gravida  2   Para  1   Term  1   Preterm      AB      Living  1      SAB      IAB      Ectopic      Multiple  0   Live Births  1           Past Medical History:  Diagnosis Date   Medical history non-contributory     Past Surgical History:  Procedure Laterality Date   CESAREAN SECTION N/A 07/30/2020   Procedure: PRIMARY CESAREAN SECTION EDC: 08-06-20  NKDA  NEED RNFA;  Surgeon: Mitchel Honour, DO;  Location: [facility];  Service: Obstetrics;  Laterality: N/A;  Heather,  RNFA   CYSTOSCOPY     WISDOM TOOTH EXTRACTION      Family History  Problem Relation Age of Onset   Diabetes Mother     Social History   Tobacco Use   Smoking status: Never   Smokeless tobacco: Never  Vaping Use   Vaping Use: Never used  Substance  Use Topics   Alcohol use: Not Currently   Drug use: Never    Allergies: No Known Allergies  Medications Prior to Admission  Medication Sig Dispense Refill Last Dose   acetaminophen (TYLENOL) 325 MG tablet Take 2 tablets (650 mg total) by mouth every 6 (six) hours as needed for mild pain (temperature > 101.5.). 60 tablet 0 Past Week   Prenatal Vit-Fe Fumarate-FA (PRENATAL PO) Take 1 tablet by mouth every evening.   11/13/2022   clindamycin (CLEOCIN) 300 MG capsule Take 300 mg by mouth every 6 (six) hours.      ibuprofen (ADVIL) 800 MG tablet Take 1 tablet (800 mg total) by mouth every 6 (six) hours as needed. 60 tablet 0     Review of Systems  Constitutional:  Negative for appetite change, chills, fatigue and fever.  Eyes:  Negative for pain and visual disturbance.  Respiratory:  Negative for apnea, shortness of breath and wheezing.   Cardiovascular:  Negative for chest pain and palpitations.  Gastrointestinal:  Positive for abdominal pain. Negative for abdominal distention, blood in stool, constipation, diarrhea,  nausea and vomiting.  Genitourinary:  Negative for difficulty urinating, dysuria, flank pain, pelvic pain, vaginal bleeding, vaginal discharge and vaginal pain.  Musculoskeletal:  Negative for back pain.  Skin:  Negative for color change, rash and wound.  Neurological:  Negative for seizures, weakness and headaches.  Hematological:  Does not bruise/bleed easily.  Psychiatric/Behavioral:  Negative for suicidal ideas.    Physical Exam   Blood pressure 123/64, pulse 78, temperature 98.1 F (36.7 C), resp. rate 17, height 5\' 5"  (1.651 m), weight 73.4 kg, SpO2 99 %, currently breastfeeding.  Physical Exam Vitals and nursing note reviewed. Exam conducted with a chaperone present.  Constitutional:      General: She is not in acute distress.    Appearance: She is well-developed.  Abdominal:     General: There is no distension.     Palpations: Abdomen is soft. There is no mass.      Tenderness: There is no abdominal tenderness. There is no guarding or rebound.     Hernia: No hernia is present.       Comments: Top of the fundus palpated in the same area that patient states pain occurs. Fetal movement with small parts palpated in this area.   Genitourinary:    General: Normal vulva.     Exam position: Lithotomy position.     Vagina: Tenderness present. No vaginal discharge.       Comments: Patient noted tenderness at anterior vaginal introitus. Small amount of redness noted on exam.   Cervical Exam: Closed /Thick/ CNM  Neurological:     Mental Status: She is alert.    FHT: 125bpm with moderate variability. No decels noted (appropriate for gestational age)  Toco: Quiet   MAU Course  Procedures  Meds ordered this encounter  Medications   capsaicin (ZOSTRIX) 0.025 % cream    Orders Placed This Encounter  Procedures   Wet prep, genital   Culture, OB Urine   Evlyn Courier MFM OB LIMITED   Urinalysis, Routine w reflex microscopic   Lipase, blood   Amylase   Comprehensive metabolic panel   Protein / creatinine ratio, urine   Results for orders placed or performed during the hospital encounter of 11/13/22 (from the past 24 hour(s))  Urinalysis, Routine w reflex microscopic Urine, Clean Catch     Status: Abnormal   Collection Time: 11/13/22  9:07 PM  Result Value Ref Range   Color, Urine STRAW (A) YELLOW   APPearance CLEAR CLEAR   Specific Gravity, Urine 1.004 (L) 1.005 - 1.030   pH 7.0 5.0 - 8.0   Glucose, UA NEGATIVE NEGATIVE mg/dL   Hgb urine dipstick NEGATIVE NEGATIVE   Bilirubin Urine NEGATIVE NEGATIVE   Ketones, ur NEGATIVE NEGATIVE mg/dL   Protein, ur NEGATIVE NEGATIVE mg/dL   Nitrite NEGATIVE NEGATIVE   Leukocytes,Ua NEGATIVE NEGATIVE  Wet prep, genital     Status: Abnormal   Collection Time: 11/13/22  9:07 PM  Result Value Ref Range   Yeast Wet Prep HPF POC NONE SEEN NONE SEEN   Trich, Wet Prep NONE SEEN NONE SEEN   Clue Cells  Wet Prep HPF POC PRESENT (A) NONE SEEN   WBC, Wet Prep HPF POC >=10 (A) <10   Sperm NONE SEEN   Protein / creatinine ratio, urine     Status: None   Collection Time: 11/13/22  9:07 PM  Result Value Ref Range   Creatinine, Urine 25 mg/dL   Total Protein, Urine <6 mg/dL  Protein Creatinine Ratio        0.00 - 0.15 mg/mg[Cre]  Lipase, blood     Status: None   Collection Time: 11/13/22  9:23 PM  Result Value Ref Range   Lipase 34 11 - 51 U/L  Amylase     Status: None   Collection Time: 11/13/22  9:23 PM  Result Value Ref Range   Amylase 72 28 - 100 U/L  Comprehensive metabolic panel     Status: Abnormal   Collection Time: 11/13/22  9:23 PM  Result Value Ref Range   Sodium 135 135 - 145 mmol/L   Potassium 3.3 (L) 3.5 - 5.1 mmol/L   Chloride 105 98 - 111 mmol/L   CO2 21 (L) 22 - 32 mmol/L   Glucose, Bld 86 70 - 99 mg/dL   BUN 7 6 - 20 mg/dL   Creatinine, Ser 1.16 0.44 - 1.00 mg/dL   Calcium 8.7 (L) 8.9 - 10.3 mg/dL   Total Protein 5.7 (L) 6.5 - 8.1 g/dL   Albumin 3.2 (L) 3.5 - 5.0 g/dL   AST 16 15 - 41 U/L   ALT 13 0 - 44 U/L   Alkaline Phosphatase 55 38 - 126 U/L   Total Bilirubin 0.4 0.3 - 1.2 mg/dL   GFR, Estimated >57 >90 mL/min   Anion gap 9 5 - 15     MDM - Patient not actively in pain, and denies pain since arrival to MAU.  - Abdomen soft and non-tender to palpation.  - Abdominal pain intermittent, inconsistent and resolves with rest. Cervical exam closed. Low suspicion for Preterm labor. Per Patient report and physical exam, pain is  Consistent with [facility] Contractions.  - Fetal tracing appropriate for gestational age.   - Patient request Ultrasound and lab work. Labs and [place] ordered.  - Wet prep positive for Clue Cells consistent with BV  - UA straw colored urine with 1.004 but otherwise normal. Low suspicion for UTI   - PCR normal  - PreLim results reviewed. Single living IUP in breech presentation with visible cardiac activity. AFI normal. Posterior  placenta. No placenta abruption or previa identified.  - Normal cervical length of 3.8 cm. Low suspicion for preterm labor.  - Lipase normal  - Amylase normal  - Liver Enzymes normal- low suspicion for PreE or acute liver injury.  - Patient liked the capsicin cream and desires use at home.  - Plan for discharge.   Assessment and Plan   1. Abdominal pain affecting pregnancy   2. Breech presentation, single or unspecified fetus   3. BV (bacterial vaginosis)   4. [redacted] weeks gestation of pregnancy    - Reviewed the results with patient. Discussed that BV can cause uterine tenderness and irritability.  - Discussed treatment. Rx for Metronidazole sent to outpatient pharmacy. Admin instruction reviewed at patient bedside.  - Also discussed fetus in breech presentation and could  be contributing abdominal pain.  - Capsicin cream provided for patient to take home. Admin instruction provided at bedside.  - Encouraged to increase water intake throughout the day.  - FHT appropriate for gestational age upon discharge.  - Worsening signs and return precautions reviewed.  - Preterm labor precautions reviewed.  - Patient discharged home in stable condition and may return to MAU as needed.    Claudette Head, MSN CNM  11/13/2022, 8:11 PM

## 2022-11-14 LAB — CULTURE, OB URINE: Culture: NO GROWTH

## 2023-01-21 LAB — OB RESULTS CONSOLE GBS: GBS: NEGATIVE

## 2023-01-26 NOTE — Patient Instructions (Signed)
Karen Perkins [facility]  01/26/2023   Your procedure is scheduled on:  02/09/2023  Arrive at 42 at Entrance C on [address] at [facility]  and [employer]. You are invited to use the FREE valet parking or use the Visitor's parking deck.  Pick up the phone at the desk and dial [phone].  Call this number if you have problems the morning of surgery: [phone]  Remember:   Do not eat food:(After Midnight) Desps de medianoche.  Do not drink clear liquids: (After Midnight) Desps de medianoche.  Take these medicines the morning of surgery with A SIP OF WATER:  none   Do not wear jewelry, make-up or nail polish.  Do not wear lotions, powders, or perfumes. Do not wear deodorant.  Do not shave 48 hours prior to surgery.  Do not bring valuables to the hospital.  [facility] is not   responsible for any belongings or valuables brought to the hospital.  Contacts, dentures or bridgework may not be worn into surgery.  Leave suitcase in the car. After surgery it may be brought to your room.  For patients admitted to the hospital, checkout time is 11:00 AM the day of              discharge.      Please read over the following fact sheets that you were given:     Preparing for Surgery

## 2023-01-28 ENCOUNTER — Encounter (HOSPITAL_COMMUNITY): Payer: Self-pay

## 2023-02-06 ENCOUNTER — Encounter (HOSPITAL_COMMUNITY)
Admission: RE | Admit: 2023-02-06 | Discharge: 2023-02-06 | Disposition: A | Discharge status: Home / Self Care | Payer: BC Managed Care – PPO | Source: Ambulatory Visit | Attending: Obstetrics and Gynecology | Admitting: Obstetrics and Gynecology

## 2023-02-06 DIAGNOSIS — Z01812 Encounter for preprocedural laboratory examination: Secondary | ICD-10-CM | POA: Insufficient documentation

## 2023-02-06 LAB — TYPE AND SCREEN
ABO/RH(D): AB NEG
Antibody Screen: POSITIVE

## 2023-02-06 LAB — CBC
HCT: 37.8 % (ref 36.0–46.0)
Hemoglobin: 12.5 g/dL (ref 12.0–15.0)
MCH: 32.6 pg (ref 26.0–34.0)
MCHC: 33.1 g/dL (ref 30.0–36.0)
MCV: 98.4 fL (ref 80.0–100.0)
Platelets: 167 10*3/uL (ref 150–400)
RBC: 3.84 MIL/uL — ABNORMAL LOW (ref 3.87–5.11)
RDW: 12.6 % (ref 11.5–15.5)
WBC: 5.8 10*3/uL (ref 4.0–10.5)
nRBC: 0 % (ref 0.0–0.2)

## 2023-02-06 NOTE — H&P (Signed)
Preop History and Physical - Scheduled Repeat CS  Karen Perkins is a 31 y.o. G3P1011 at 24w1dpresenting for elective repeat CS - had history of Csx1 for breech presentation. She is Rh negative. Otherwise pregnancy has been uncomplicated.  OB History     Gravida  3   Para  1   Term  1   Preterm      AB  1   Living  1      SAB  1   IAB      Ectopic      Multiple  0   Live Births  1          Past Medical History:  Diagnosis Date   Medical history non-contributory    Past Surgical History:  Procedure Laterality Date   CESAREAN SECTION N/A 07/30/2020   Procedure: PRIMARY CESAREAN SECTION EDC: 08-06-20  NKDA  NEED RNFA;  Surgeon: MLinda Hedges DO;  Location: [facility] LD ORS;  Service: Obstetrics;  Laterality: N/A;  Heather,  RNFA   CYSTOSCOPY     WISDOM TOOTH EXTRACTION     Family History: family history includes Diabetes in her mother. Social History:  reports that she has never smoked. She has never used smokeless tobacco. She reports that she does not currently use alcohol. She reports that she does not use drugs.    Maternal Diabetes: No Genetic Screening: Normal Maternal Ultrasounds/Referrals: Normal Fetal Ultrasounds or other Referrals:  None Maternal Substance Abuse:  No Significant Maternal Medications:  None Significant Maternal Lab Results:  Rh negative Number of Prenatal Visits:greater than 3 verified prenatal visits Other Comments:  None  Vitals reviewed from office visits, wnl  Exams from office visits wnl, cervix has been closed. Constitutional:      Appearance: Normal appearance.  HENT:     Head: Normocephalic.  Eyes:     Pupils: Pupils are equal, round. Cardiovascular:     Rate and Rhythm: Normal rate and regular rhythm.     Pulses: Normal pulses.  Abdominal:     General: Abdomen is Gravid, nontender Neurological:     Mental Status: She is alert.   Prenatal labs: ABO, Rh: --/--/PENDING (03/01 [id number] Antibody: PENDING (03/01  0941) Rubella: Immune (08/07 0000) RPR: Nonreactive (08/07 0000)  HBsAg: Negative (08/07 0000)  HIV: Non-reactive (08/07 0000)  GBS: Negative/-- (02/14 0000)   Assessment/Plan: Karen Gandolfois a 32y.o. G3P1011 at 351w1dresenting for scheduled rCS.  Recent CBC 3/1 wnl. Risks discussed including infection, bleeding, damage to surrounding structures, the need for additional procedures including hysterectomy, and the possibility of uterine rupture with neonatal morbidity/mortality, scarring, and abnormal placentation with subsequent pregnancies. Patient agrees to proceed.     JaCharlotta Newton/12/2022, 10:25 AM

## 2023-02-06 NOTE — Patient Instructions (Signed)
[facility]  02/06/2023   Your procedure is scheduled on:  02/09/2023  Arrive at 36 at Entrance C on [address] at [facility]  and [employer] [employer] [address]. You are invited to use the FREE valet parking or use the Visitor's parking deck.  Pick up the phone at the desk and dial [phone].  Call this number if you have problems the morning of surgery: [phone]  Remember:   Do not eat food:(After Midnight) Desps de medianoche.  Do not drink clear liquids: (After Midnight) Desps de medianoche.  Take these medicines the morning of surgery with A SIP OF WATER:  none   Do not wear jewelry, make-up or nail polish.  Do not wear lotions, powders, or perfumes. Do not wear deodorant.  Do not shave 48 hours prior to surgery.  Do not bring valuables to the hospital.  [facility] is not   responsible for any belongings or valuables brought to the hospital.  Contacts, dentures or bridgework may not be worn into surgery.  Leave suitcase in the car. After surgery it may be brought to your room.  For patients admitted to the hospital, checkout time is 11:00 AM the day of              discharge.      Please read over the following fact sheets that you were given:     Preparing for Surgery

## 2023-02-07 LAB — RPR: RPR Ser Ql: NONREACTIVE

## 2023-02-08 ENCOUNTER — Encounter (HOSPITAL_COMMUNITY): Payer: Self-pay | Admitting: Obstetrics and Gynecology

## 2023-02-09 ENCOUNTER — Inpatient Hospital Stay (HOSPITAL_COMMUNITY)
Admission: AD | Admit: 2023-02-09 | Discharge: 2023-02-11 | DRG: 788 | Disposition: A | Discharge status: Home / Self Care | Payer: BC Managed Care – PPO | Attending: Obstetrics and Gynecology | Admitting: Obstetrics and Gynecology

## 2023-02-09 ENCOUNTER — Encounter (HOSPITAL_COMMUNITY)
Admission: AD | Disposition: A | Discharge status: Home / Self Care | Payer: Self-pay | Source: Home / Self Care | Attending: Obstetrics and Gynecology

## 2023-02-09 ENCOUNTER — Encounter (HOSPITAL_COMMUNITY): Payer: Self-pay | Admitting: Obstetrics and Gynecology

## 2023-02-09 ENCOUNTER — Inpatient Hospital Stay (HOSPITAL_COMMUNITY): Payer: BC Managed Care – PPO | Admitting: Anesthesiology

## 2023-02-09 ENCOUNTER — Other Ambulatory Visit: Payer: Self-pay

## 2023-02-09 DIAGNOSIS — Z3A39 39 weeks gestation of pregnancy: Secondary | ICD-10-CM

## 2023-02-09 DIAGNOSIS — O34211 Maternal care for low transverse scar from previous cesarean delivery: Principal | ICD-10-CM | POA: Diagnosis present

## 2023-02-09 DIAGNOSIS — Z6791 Unspecified blood type, Rh negative: Secondary | ICD-10-CM

## 2023-02-09 DIAGNOSIS — O26893 Other specified pregnancy related conditions, third trimester: Secondary | ICD-10-CM | POA: Diagnosis present

## 2023-02-09 DIAGNOSIS — Z98891 History of uterine scar from previous surgery: Principal | ICD-10-CM

## 2023-02-09 SURGERY — Surgical Case
Anesthesia: Spinal

## 2023-02-09 MED ORDER — ZOLPIDEM TARTRATE 5 MG PO TABS: 5.0000 mg | ORAL_TABLET | Freq: Every evening | ORAL | Status: DC | PRN

## 2023-02-09 MED ORDER — PHENYLEPHRINE HCL-NACL 20-0.9 MG/250ML-% IV SOLN
INTRAVENOUS | Status: AC
  Filled 2023-02-09: qty 250

## 2023-02-09 MED ORDER — DIPHENHYDRAMINE HCL 25 MG PO CAPS: 25.0000 mg | ORAL_CAPSULE | Freq: Four times a day (QID) | ORAL | Status: DC | PRN

## 2023-02-09 MED ORDER — MEPERIDINE HCL 25 MG/ML IJ SOLN: 6.2500 mg | INTRAMUSCULAR | Status: DC | PRN

## 2023-02-09 MED ORDER — MORPHINE SULFATE (PF) 0.5 MG/ML IJ SOLN
INTRAMUSCULAR | Status: AC
  Filled 2023-02-09: qty 10

## 2023-02-09 MED ORDER — FENTANYL CITRATE (PF) 100 MCG/2ML IJ SOLN
INTRAMUSCULAR | Status: DC | PRN
  Administered 2023-02-09: 35 ug via INTRAVENOUS
  Administered 2023-02-09: 50 ug via INTRAVENOUS

## 2023-02-09 MED ORDER — NALOXONE HCL 0.4 MG/ML IJ SOLN: 0.4000 mg | INTRAMUSCULAR | Status: DC | PRN

## 2023-02-09 MED ORDER — FENTANYL CITRATE (PF) 100 MCG/2ML IJ SOLN
INTRAMUSCULAR | Status: DC | PRN
  Administered 2023-02-09: 15 ug via INTRATHECAL

## 2023-02-09 MED ORDER — DIPHENHYDRAMINE HCL 50 MG/ML IJ SOLN
12.5000 mg | INTRAMUSCULAR | Status: DC | PRN
  Administered 2023-02-10: 12.5 mg via INTRAVENOUS
  Filled 2023-02-09: qty 1

## 2023-02-09 MED ORDER — KETOROLAC TROMETHAMINE 30 MG/ML IJ SOLN: 30.0000 mg | Freq: Four times a day (QID) | INTRAMUSCULAR | Status: AC | PRN

## 2023-02-09 MED ORDER — SIMETHICONE 80 MG PO CHEW
80.0000 mg | CHEWABLE_TABLET | ORAL | Status: DC | PRN
  Administered 2023-02-10 – 2023-02-11 (×3): 80 mg via ORAL
  Filled 2023-02-09 (×2): qty 1

## 2023-02-09 MED ORDER — SODIUM CHLORIDE 0.9% FLUSH: 3.0000 mL | INTRAVENOUS | Status: DC | PRN

## 2023-02-09 MED ORDER — PHENYLEPHRINE HCL-NACL 20-0.9 MG/250ML-% IV SOLN
INTRAVENOUS | Status: DC | PRN
  Administered 2023-02-09: 60 ug/min via INTRAVENOUS

## 2023-02-09 MED ORDER — STERILE WATER FOR IRRIGATION IR SOLN
Status: DC | PRN
  Administered 2023-02-09: 1000 mL

## 2023-02-09 MED ORDER — OXYTOCIN-SODIUM CHLORIDE 30-0.9 UT/500ML-% IV SOLN
INTRAVENOUS | Status: DC | PRN
  Administered 2023-02-09: 300 mL via INTRAVENOUS

## 2023-02-09 MED ORDER — HYDROMORPHONE HCL 1 MG/ML IJ SOLN
INTRAMUSCULAR | Status: AC
  Filled 2023-02-09: qty 0.5

## 2023-02-09 MED ORDER — ONDANSETRON HCL 4 MG/2ML IJ SOLN: 4.0000 mg | Freq: Three times a day (TID) | INTRAMUSCULAR | Status: DC | PRN

## 2023-02-09 MED ORDER — OXYTOCIN-SODIUM CHLORIDE 30-0.9 UT/500ML-% IV SOLN
2.5000 [IU]/h | INTRAVENOUS | Status: AC
  Administered 2023-02-09: 2.5 [IU]/h via INTRAVENOUS
  Filled 2023-02-09: qty 500

## 2023-02-09 MED ORDER — SODIUM CHLORIDE 0.9 % IR SOLN
Status: DC | PRN
  Administered 2023-02-09: 1

## 2023-02-09 MED ORDER — BUPIVACAINE IN DEXTROSE 0.75-8.25 % IT SOLN
INTRATHECAL | Status: DC | PRN
  Administered 2023-02-09: 1.5 mL via INTRATHECAL

## 2023-02-09 MED ORDER — MORPHINE SULFATE (PF) 0.5 MG/ML IJ SOLN
INTRAMUSCULAR | Status: DC | PRN
  Administered 2023-02-09: 150 ug via INTRATHECAL

## 2023-02-09 MED ORDER — DEXMEDETOMIDINE HCL IN NACL 80 MCG/20ML IV SOLN
INTRAVENOUS | Status: DC | PRN
  Administered 2023-02-09: 8 ug via BUCCAL
  Administered 2023-02-09: 12 ug via BUCCAL

## 2023-02-09 MED ORDER — WITCH HAZEL-GLYCERIN EX PADS: 1.0000 | MEDICATED_PAD | CUTANEOUS | Status: DC | PRN

## 2023-02-09 MED ORDER — KETOROLAC TROMETHAMINE 30 MG/ML IJ SOLN: 30.0000 mg | Freq: Once | INTRAMUSCULAR | Status: DC | PRN

## 2023-02-09 MED ORDER — SCOPOLAMINE 1 MG/3DAYS TD PT72: 1.0000 | MEDICATED_PATCH | Freq: Once | TRANSDERMAL | Status: DC

## 2023-02-09 MED ORDER — FENTANYL CITRATE (PF) 100 MCG/2ML IJ SOLN
INTRAMUSCULAR | Status: AC
  Filled 2023-02-09: qty 2

## 2023-02-09 MED ORDER — SOD CITRATE-CITRIC ACID 500-334 MG/5ML PO SOLN
ORAL | Status: AC
  Filled 2023-02-09: qty 30

## 2023-02-09 MED ORDER — KETOROLAC TROMETHAMINE 30 MG/ML IJ SOLN
30.0000 mg | Freq: Four times a day (QID) | INTRAMUSCULAR | Status: AC | PRN
  Administered 2023-02-09: 30 mg via INTRAVENOUS

## 2023-02-09 MED ORDER — ACETAMINOPHEN 500 MG PO TABS: 1000.0000 mg | ORAL_TABLET | Freq: Four times a day (QID) | ORAL | Status: DC

## 2023-02-09 MED ORDER — HYDROMORPHONE HCL 1 MG/ML IJ SOLN: 0.2000 mg | INTRAMUSCULAR | Status: DC | PRN

## 2023-02-09 MED ORDER — IBUPROFEN 600 MG PO TABS
600.0000 mg | ORAL_TABLET | Freq: Four times a day (QID) | ORAL | Status: DC
  Administered 2023-02-10 – 2023-02-11 (×3): 600 mg via ORAL
  Filled 2023-02-09 (×3): qty 1

## 2023-02-09 MED ORDER — KETOROLAC TROMETHAMINE 30 MG/ML IJ SOLN
30.0000 mg | Freq: Four times a day (QID) | INTRAMUSCULAR | Status: AC
  Administered 2023-02-09 – 2023-02-10 (×2): 30 mg via INTRAVENOUS
  Filled 2023-02-09 (×3): qty 1

## 2023-02-09 MED ORDER — PRENATAL MULTIVITAMIN CH
1.0000 | ORAL_TABLET | Freq: Every day | ORAL | Status: DC
  Administered 2023-02-10: 1 via ORAL
  Filled 2023-02-09: qty 1

## 2023-02-09 MED ORDER — KETOROLAC TROMETHAMINE 30 MG/ML IJ SOLN
INTRAMUSCULAR | Status: AC
  Filled 2023-02-09: qty 1

## 2023-02-09 MED ORDER — DIBUCAINE (PERIANAL) 1 % EX OINT: 1.0000 | TOPICAL_OINTMENT | CUTANEOUS | Status: DC | PRN

## 2023-02-09 MED ORDER — SENNOSIDES-DOCUSATE SODIUM 8.6-50 MG PO TABS
2.0000 | ORAL_TABLET | Freq: Every day | ORAL | Status: DC
  Administered 2023-02-10 – 2023-02-11 (×2): 2 via ORAL
  Filled 2023-02-09 (×2): qty 2

## 2023-02-09 MED ORDER — ONDANSETRON HCL 4 MG/2ML IJ SOLN
INTRAMUSCULAR | Status: DC | PRN
  Administered 2023-02-09: 4 mg via INTRAVENOUS

## 2023-02-09 MED ORDER — ONDANSETRON HCL 4 MG/2ML IJ SOLN: 4.0000 mg | Freq: Once | INTRAMUSCULAR | Status: DC | PRN

## 2023-02-09 MED ORDER — ACETAMINOPHEN 500 MG PO TABS
1000.0000 mg | ORAL_TABLET | Freq: Four times a day (QID) | ORAL | Status: DC
  Administered 2023-02-09 – 2023-02-11 (×6): 1000 mg via ORAL
  Filled 2023-02-09 (×7): qty 2

## 2023-02-09 MED ORDER — HYDROMORPHONE HCL 1 MG/ML IJ SOLN
0.2500 mg | INTRAMUSCULAR | Status: DC | PRN
  Administered 2023-02-09 (×3): 0.5 mg via INTRAVENOUS

## 2023-02-09 MED ORDER — DEXMEDETOMIDINE HCL IN NACL 80 MCG/20ML IV SOLN
INTRAVENOUS | Status: AC
  Filled 2023-02-09: qty 20

## 2023-02-09 MED ORDER — CEFAZOLIN SODIUM-DEXTROSE 2-4 GM/100ML-% IV SOLN
2.0000 g | INTRAVENOUS | Status: AC
  Administered 2023-02-09: 2 g via INTRAVENOUS

## 2023-02-09 MED ORDER — SOD CITRATE-CITRIC ACID 500-334 MG/5ML PO SOLN
30.0000 mL | ORAL | Status: AC
  Administered 2023-02-09: 30 mL via ORAL

## 2023-02-09 MED ORDER — COCONUT OIL OIL: 1.0000 | TOPICAL_OIL | Status: DC | PRN

## 2023-02-09 MED ORDER — CEFAZOLIN SODIUM-DEXTROSE 2-4 GM/100ML-% IV SOLN
INTRAVENOUS | Status: AC
  Filled 2023-02-09: qty 100

## 2023-02-09 MED ORDER — MENTHOL 3 MG MT LOZG: 1.0000 | LOZENGE | OROMUCOSAL | Status: DC | PRN

## 2023-02-09 MED ORDER — NALOXONE HCL 4 MG/10ML IJ SOLN: 1.0000 ug/kg/h | INTRAVENOUS | Status: DC | PRN

## 2023-02-09 MED ORDER — SIMETHICONE 80 MG PO CHEW
80.0000 mg | CHEWABLE_TABLET | Freq: Three times a day (TID) | ORAL | Status: DC
  Administered 2023-02-09 – 2023-02-11 (×5): 80 mg via ORAL
  Filled 2023-02-09 (×5): qty 1

## 2023-02-09 MED ORDER — OXYCODONE HCL 5 MG PO TABS: 5.0000 mg | ORAL_TABLET | Freq: Four times a day (QID) | ORAL | Status: DC | PRN

## 2023-02-09 MED ORDER — DIPHENHYDRAMINE HCL 25 MG PO CAPS: 25.0000 mg | ORAL_CAPSULE | ORAL | Status: DC | PRN

## 2023-02-09 MED ORDER — DEXAMETHASONE SODIUM PHOSPHATE 10 MG/ML IJ SOLN
INTRAMUSCULAR | Status: DC | PRN
  Administered 2023-02-09: 8 mg via INTRAVENOUS

## 2023-02-09 MED ORDER — DEXAMETHASONE SODIUM PHOSPHATE 4 MG/ML IJ SOLN
INTRAMUSCULAR | Status: AC
  Filled 2023-02-09: qty 2

## 2023-02-09 MED ORDER — LACTATED RINGERS IV SOLN: INTRAVENOUS | Status: DC

## 2023-02-09 MED ORDER — ONDANSETRON HCL 4 MG/2ML IJ SOLN
INTRAMUSCULAR | Status: AC
  Filled 2023-02-09: qty 2

## 2023-02-09 MED ORDER — OXYCODONE HCL 5 MG PO TABS: 5.0000 mg | ORAL_TABLET | ORAL | Status: DC | PRN

## 2023-02-09 SURGICAL SUPPLY — 36 items
ADH SKN CLS APL DERMABOND .7 (GAUZE/BANDAGES/DRESSINGS)
APL PRP STRL LF DISP 70% ISPRP (MISCELLANEOUS) ×2
APL SKNCLS STERI-STRIP NONHPOA (GAUZE/BANDAGES/DRESSINGS) ×1
BENZOIN TINCTURE PRP APPL 2/3 (GAUZE/BANDAGES/DRESSINGS) ×1 IMPLANT
CHLORAPREP W/TINT 26 (MISCELLANEOUS) ×2 IMPLANT
CLAMP UMBILICAL CORD (MISCELLANEOUS) ×1 IMPLANT
CLOTH BEACON ORANGE TIMEOUT ST (SAFETY) ×1 IMPLANT
DERMABOND ADVANCED .7 DNX12 (GAUZE/BANDAGES/DRESSINGS) IMPLANT
DRSG OPSITE POSTOP 4X10 (GAUZE/BANDAGES/DRESSINGS) ×1 IMPLANT
ELECT REM PT RETURN 9FT ADLT (ELECTROSURGICAL) ×1
ELECTRODE REM PT RTRN 9FT ADLT (ELECTROSURGICAL) ×1 IMPLANT
EXTRACTOR VACUUM KIWI (MISCELLANEOUS) IMPLANT
GAUZE PAD ABD 8X10 STRL (GAUZE/BANDAGES/DRESSINGS) IMPLANT
GAUZE SPONGE 4X4 12PLY STRL LF (GAUZE/BANDAGES/DRESSINGS) IMPLANT
GLOVE BIOGEL PI MICRO STRL 5.5 (GLOVE) ×1 IMPLANT
GLOVE SS PI  5.5 STRL (GLOVE) ×1
GLOVE SS PI 5.5 STRL (GLOVE) ×1 IMPLANT
GOWN STRL REUS W/TWL LRG LVL3 (GOWN DISPOSABLE) ×2 IMPLANT
KIT ABG SYR 3ML LUER SLIP (SYRINGE) ×1 IMPLANT
NDL HYPO 25X5/8 SAFETYGLIDE (NEEDLE) ×1 IMPLANT
NEEDLE HYPO 25X5/8 SAFETYGLIDE (NEEDLE) ×1 IMPLANT
NS IRRIG 1000ML POUR BTL (IV SOLUTION) ×1 IMPLANT
PACK C SECTION WH (CUSTOM PROCEDURE TRAY) ×1 IMPLANT
PAD OB MATERNITY 4.3X12.25 (PERSONAL CARE ITEMS) ×1 IMPLANT
RTRCTR C-SECT PINK 25CM LRG (MISCELLANEOUS) ×1 IMPLANT
STRIP CLOSURE SKIN 1/2X4 (GAUZE/BANDAGES/DRESSINGS) IMPLANT
SUT MON AB 2-0 CT1 27 (SUTURE) ×1 IMPLANT
SUT MON AB-0 CT1 36 (SUTURE) ×1 IMPLANT
SUT PDS AB 0 CTX 36 PDP370T (SUTURE) IMPLANT
SUT VIC AB 0 CTX 36 (SUTURE) ×2
SUT VIC AB 0 CTX36XBRD ANBCTRL (SUTURE) ×2 IMPLANT
SUT VIC AB 4-0 SH 27 (SUTURE) ×1
SUT VIC AB 4-0 SH 27XANBCTRL (SUTURE) ×1 IMPLANT
TOWEL OR 17X24 6PK STRL BLUE (TOWEL DISPOSABLE) ×1 IMPLANT
TRAY FOLEY W/BAG SLVR 14FR LF (SET/KITS/TRAYS/PACK) IMPLANT
WATER STERILE IRR 1000ML POUR (IV SOLUTION) ×1 IMPLANT

## 2023-02-09 NOTE — Progress Notes (Signed)
No updates to H&P. Patient arrived NPO and was consented in PACU. Risks again discussed, all questions answered, and consent signed. Proceed with rCS as scheduled as scheduled.   Hurshel Party, MD

## 2023-02-09 NOTE — Transfer of Care (Signed)
Immediate Anesthesia Transfer of Care Note  Patient: Malyka Winnie [facility]  Procedure(s) Performed: REPEAT CESAREAN SECTION EDC: 02-15-23  ALLERG: NKDA PREVIOUS X 1  Patient Location: PACU  Anesthesia Type:Spinal  Level of Consciousness: awake  Airway & Oxygen Therapy: Patient Spontanous Breathing  Post-op Assessment: Report given to RN and Post -op Vital signs reviewed and stable  Post vital signs: Reviewed and stable  Last Vitals:  Vitals Value Taken Time  BP 109/68 02/09/23 1424  Temp    Pulse 70 02/09/23 1426  Resp 16 02/09/23 1426  SpO2 95 % 02/09/23 1426  Vitals shown include unvalidated device data.  Last Pain:  Vitals:   02/09/23 1117  TempSrc: Oral  PainSc:          Complications: No notable events documented.

## 2023-02-09 NOTE — Anesthesia Postprocedure Evaluation (Addendum)
Anesthesia Post Note  Patient: Karen Perkins  Procedure(s) Performed: REPEAT CESAREAN SECTION EDC: 02-15-23  ALLERG: NKDA PREVIOUS X 1     Patient location during evaluation: PACU Anesthesia Type: Spinal Level of consciousness: awake Pain management: pain level controlled Vital Signs Assessment: post-procedure vital signs reviewed and stable Respiratory status: spontaneous breathing Cardiovascular status: stable Postop Assessment: no headache, no backache, spinal receding, patient able to bend at knees and no apparent nausea or vomiting Anesthetic complications: no  No notable events documented.  Last Vitals:  Vitals:   02/09/23 1537 02/09/23 1640  BP: 114/79 122/80  Pulse: 72 (!) 59  Resp: 16 16  Temp: (!) 36.4 C 36.4 C  SpO2: 95% 97%    Last Pain:  Vitals:   02/09/23 1640  TempSrc: Axillary  PainSc: 7    Pain Goal:                   Huston Foley

## 2023-02-09 NOTE — Op Note (Signed)
PROCEDURE DATE: 02/09/2023    PREOPERATIVE DIAGNOSIS: History of C-Section x1, Rh negative   POSTOPERATIVE DIAGNOSIS: The same   PROCEDURE: Repeat Low Transverse Cesarean Section   SURGEON:  Dr. Hurshel Party  ASSISTANT: Dr. Gifford Shave An experienced assistant was required given the standard of surgical care given the complexity of the case.  This assistant was needed for exposure, dissection, suctioning, retraction, instrument exchange, assisting with delivery with administration of fundal pressure, and for overall help during the procedure.   INDICATIONS: This is a 32yo G3P1011 at 58w1drequiring cesarean section secondary to elective repeat.   Decision made to proceed with LTCS. The risks of cesarean section discussed with the patient included but were not limited to: bleeding which may require transfusion or reoperation; infection which may require antibiotics; injury to bowel, bladder, ureters or other surrounding organs; injury to the fetus; need for additional procedures including hysterectomy in the event of a life-threatening hemorrhage; placental abnormalities wth subsequent pregnancies, incisional problems, thromboembolic phenomenon and other postoperative/anesthesia complications. The patient agreed with the proposed plan, giving informed consent for the procedure.     FINDINGS:  Viable female infant in cephalic presentation, APGARs 9+9, Weight pending, Amniotic fluid clear, Intact placenta, three vessel cord.  Grossly normal uterus, bilateral ovaries, and fallopian tubes. No intraabdominal adhesions, mild adhesions between fascia and rectus muscles. .   ANESTHESIA:  Spinal ESTIMATED BLOOD LOSS: 154ccs SPECIMENS: Placenta for routine COMPLICATIONS: None immediate   PROCEDURE IN DETAIL:  The patient received intravenous antibiotics (2g Ancef) and had sequential compression devices applied to her lower extremities while in the preoperative area.  She was then taken to the operating  room where spinal anesthesia obtained. She was then placed in a dorsal supine position with a leftward tilt, and prepped and draped in a sterile manner.  A foley catheter was placed into her bladder and attached to constant gravity.  After an adequate timeout was performed, a Pfannenstiel skin incision was made with scalpel and carried through to the underlying layer of fascia. The fascia was incised in the midline and this incision was extended bilaterally bluntly. Kocher clamps were applied to the superior aspect of the fascial incision and the underlying rectus muscles were dissected off bluntly and with curved Mayos. A similar process was carried out on the inferior aspect of the facial incision. The rectus muscles were separated in the midline bluntly and the peritoneum was entered bluntly.  The peritoneum was extended bilaterally and an Alexis retractor was placed for better visualization. A bladder flap was created sharply and developed bluntly. A transverse hysterotomy was made with a scalpel and extended bilaterally bluntly. The infant was successfully delivered, and cord was clamped and cut and infant was handed over to awaiting neonatology team. Cord blood was collected. Uterine massage was then administered and the placenta delivered intact with three-vessel cord. The uterus was cleared of clot and debris.  The hysterotomy was closed with 0 vicryl.  A second imbricating layer of 0-monocryl was used to reinforce the incision and aid in hemostasis. The peritoneal cavity was irrigated and excellent hemostasis was noted. The Alexis retractor was removed. The fascia was closed with 0-PDS in a running fashion with good restoration of anatomy.  The subcutaneus tissue was irrigated and was reapproximated using three interrupted 2-0 monocryl stitches.  The skin was closed with 4-0 Vicryl in a subcuticular fashion.  All surgical site and was hemostatic at end of procedure) without any further bleeding on exam.  Pt tolerated the procedure well. All sponge/lap/needle counts were correct  X 2. Pt taken to recovery room in stable condition.   Hurshel Party, MD

## 2023-02-09 NOTE — Anesthesia Procedure Notes (Signed)
Spinal  Patient location during procedure: OR Start time: 02/09/2023 12:57 PM End time: 02/09/2023 1:01 PM Reason for block: surgical anesthesia Staffing Performed: anesthesiologist  Anesthesiologist: Lyn Hollingshead, MD Performed by: Lyn Hollingshead, MD Authorized by: Lyn Hollingshead, MD   Preanesthetic Checklist Completed: patient identified, IV checked, site marked, risks and benefits discussed, surgical consent, monitors and equipment checked, pre-op evaluation and timeout performed Spinal Block Patient position: sitting Prep: DuraPrep and site prepped and draped Patient monitoring: continuous pulse ox and blood pressure Approach: midline Location: L3-4 Injection technique: single-shot Needle Needle type: Pencan  Needle gauge: 24 G Needle length: 10 cm Needle insertion depth: 5 cm Assessment Sensory level: T4 Events: CSF return

## 2023-02-09 NOTE — Anesthesia Preprocedure Evaluation (Signed)
Anesthesia Evaluation  Patient identified by MRN, date of birth, ID band Patient awake    Reviewed: Allergy & Precautions, H&P , NPO status , Patient's Chart, lab work & pertinent test results  Airway Mallampati: I       Dental no notable dental hx.    Pulmonary neg pulmonary ROS   Pulmonary exam normal        Cardiovascular Exercise Tolerance: Good negative cardio ROS Normal cardiovascular exam     Neuro/Psych negative neurological ROS  negative psych ROS   GI/Hepatic negative GI ROS, Neg liver ROS,,,  Endo/Other  negative endocrine ROS    Renal/GU negative Renal ROS  negative genitourinary   Musculoskeletal negative musculoskeletal ROS (+)    Abdominal Normal abdominal exam  (+)   Peds negative pediatric ROS (+)  Hematology negative hematology ROS (+)   Anesthesia Other Findings   Reproductive/Obstetrics (+) Pregnancy                             Anesthesia Physical Anesthesia Plan  ASA: I  Anesthesia Plan: Spinal   Post-op Pain Management:    Induction:   PONV Risk Score and Plan: 3 and Scopolamine patch - Pre-op, Ondansetron and Dexamethasone  Airway Management Planned: Natural Airway, Simple Face Mask and Nasal Cannula  Additional Equipment: None  Intra-op Plan:   Post-operative Plan:   Informed Consent: I have reviewed the patients History and Physical, chart, labs and discussed the procedure including the risks, benefits and alternatives for the proposed anesthesia with the patient or authorized representative who has indicated his/her understanding and acceptance.       Plan Discussed with: CRNA  Anesthesia Plan Comments: (  )        Anesthesia Quick Evaluation

## 2023-02-10 ENCOUNTER — Encounter (HOSPITAL_COMMUNITY): Payer: Self-pay | Admitting: Obstetrics and Gynecology

## 2023-02-10 LAB — CBC
HCT: 28.7 % — ABNORMAL LOW (ref 36.0–46.0)
Hemoglobin: 10.2 g/dL — ABNORMAL LOW (ref 12.0–15.0)
MCH: 34.3 pg — ABNORMAL HIGH (ref 26.0–34.0)
MCHC: 35.5 g/dL (ref 30.0–36.0)
MCV: 96.6 fL (ref 80.0–100.0)
Platelets: 159 10*3/uL (ref 150–400)
RBC: 2.97 MIL/uL — ABNORMAL LOW (ref 3.87–5.11)
RDW: 12.4 % (ref 11.5–15.5)
WBC: 9.4 10*3/uL (ref 4.0–10.5)
nRBC: 0 % (ref 0.0–0.2)

## 2023-02-10 LAB — HIV ANTIBODY (ROUTINE TESTING W REFLEX): HIV Screen 4th Generation wRfx: NONREACTIVE

## 2023-02-10 NOTE — Lactation Note (Signed)
This note was copied from a baby's chart. Lactation Consultation Note  Patient Name: Karen Perkins [id number] Date: 02/10/2023 Age:33 hours  Mom wants to wait to see Lactation in am.   Maternal Data    Feeding    LATCH Score                    Lactation Tools Discussed/Used    Interventions    Discharge    Consult Status      Theodoro Kalata 02/10/2023, 12:21 AM

## 2023-02-10 NOTE — Lactation Note (Signed)
This note was copied from a baby's chart. Lactation Consultation Note  Patient Name: Karen Perkins [id number] Date: 02/10/2023 Age:33 hours,  Reason for consult: Follow-up assessment;Infant weight loss;Term;Breastfeeding assistance (7 % weight loss) 2nd LC visit , the nurse called [place] due to baby acting like he wanted to feed.  Mom hand expressing when [place] entered the room. Baby still to fussy to latch after spoon feeding. LC with gloved finger had baby intermittently suck on her finger and baby was being spoon fed. Mom getting more flow. LC recommended since the baby was calmer and had an appetizer to latch. Baby latched and sustained , swallows noted and was till feeding.  LC noted when baby was sucking on her gloved finger baby has a high palate. LC explained to parents to hold off on the pacifier, and the traditional cradle position to avoid the baby being so fussy to latch.  LC recommended the football or the cross cradle position to latch the baby quickly to avoid the baby being to fussy to latch.  As for now LC recommended due to the 7 % weight loss and to do the steps for latching. - Breast massage, hand express, prepump to prime the milk ducts, appetizer if needed and latch with firm support.    Maternal Data Has patient been taught Hand Expression?: Yes Does the patient have breastfeeding experience prior to this delivery?: Yes How long did the patient breastfeed?: per mom 6-8 weeks weight loss and had to supplement, developed a breast abscess, and was on antibiotics and had to have it drained x 3 and finally resolved.  Feeding Mother's Current Feeding Choice: Breast Milk and Formula  LATCH Score Latch: Repeated attempts needed to sustain latch, nipple held in mouth throughout feeding, stimulation needed to elicit sucking reflex.  Audible Swallowing: A few with stimulation  Type of Nipple: Everted at rest and after stimulation  Comfort (Breast/Nipple): Soft / non-tender  Hold  (Positioning): Full assist, staff holds infant at breast  LATCH Score: 6   Lactation Tools Discussed/Used Tools: Pump;Flanges Flange Size: 21 Breast pump type: Manual Pump Education: Setup, frequency, and cleaning;Milk Storage Reason for Pumping: LC recommended - prior to latch breast massage, hand express, pre-pump and latch cross cradle or football  Interventions Interventions: Breast feeding basics reviewed;Assisted with latch;Skin to skin;Breast massage;Hand express;Breast compression;Adjust position;Support pillows;Position options;Hand pump;Education;LC Services brochure  Discharge    Consult Status Consult Status: Follow-up Date: 02/10/23 Follow-up type: In-patient    [place] 02/10/2023, 9:49 AM

## 2023-02-10 NOTE — Progress Notes (Signed)
Subjective: Postpartum Day 1: Cesarean Delivery Patient reports tolerating PO.  Has not voided since foley out this am.  Desires circ.  Objective: Vital signs in last 24 hours: Temp:  [97.5 F (36.4 C)-98.2 F (36.8 C)] 98 F (36.7 C) (03/05 0330) Pulse Rate:  [56-73] 62 (03/05 0330) Resp:  [12-18] 16 (03/05 0330) BP: (98-123)/(61-85) 112/77 (03/05 0330) SpO2:  [93 %-99 %] 98 % (03/05 0330) Weight:  [76.2 kg] 76.2 kg (03/04 1103)  Physical Exam:  General: alert, cooperative, and appears stated age Lochia: appropriate Uterine Fundus: firm Incision: healing well, no significant drainage, pressure dressing removed.  Honeycomb dressing c/d. DVT Evaluation: No evidence of DVT seen on physical exam. Negative Homan's sign. No cords or calf tenderness.  Recent Labs    02/10/23 0537  HGB 10.2*  HCT 28.7*    Assessment/Plan: Status post Cesarean section. Doing well postoperatively.  Continue current care. Circ-patient is counseled re: risk of bleeding, infection and scarring.  All questions were answered and patient wishes to proceed.  Linda Hedges, DO 02/10/2023, 8:09 AM

## 2023-02-10 NOTE — Lactation Note (Signed)
This note was copied from a baby's chart. Lactation Consultation Note  Patient Name: Karen Perkins [id number] Date: 02/10/2023 Age:33 hours, 1st [place] visit as mom requested LC in am Reason for consult: Initial assessment;Term;Infant weight loss;Breastfeeding assistance (7 % weight loss, per parents baby for a circ this am) As LC entered the room baby sucking strongly on a pacifier.  Per mom the baby last fed at 7:05 , both breast 30 mins.  LC mentioned since baby is at 7 % weight loss, sucking on the pacifier  Probably hungry. LC offered to assist and baby was on and off  latching, to fussy to stay in a consistent sucking pattern.  Mom ended up just placing baby STS on her chest.  Latch score 6    Maternal Data Has patient been taught Hand Expression?: Yes Does the patient have breastfeeding experience prior to this delivery?: Yes How long did the patient breastfeed?: per mom 6-8 weeks weight loss and had to supplement, developed a breast abscess, and was on antibiotics and had to have it drained x 3 and finally resolved.  Feeding Mother's Current Feeding Choice: Breast Milk and Formula  LATCH Score Latch: Repeated attempts needed to sustain latch, nipple held in mouth throughout feeding, stimulation needed to elicit sucking reflex.  Audible Swallowing: None  Type of Nipple: Everted at rest and after stimulation  Comfort (Breast/Nipple): Soft / non-tender  Hold (Positioning): Assistance needed to correctly position infant at breast and maintain latch.  LATCH Score: 6   Lactation Tools Discussed/Used    Interventions Interventions: Breast feeding basics reviewed;Assisted with latch;Skin to skin;Hand express;Breast compression;Adjust position;Support pillows;Position options;Education;LC Services brochure  Discharge    Consult Status Consult Status: Follow-up Date: 02/10/23 Follow-up type: In-patient    [place] 02/10/2023, 9:02 AM

## 2023-02-11 MED ORDER — IBUPROFEN 600 MG PO TABS: 600.0000 mg | ORAL_TABLET | Freq: Four times a day (QID) | ORAL | 0 refills | Status: AC | PRN

## 2023-02-11 MED ORDER — RHO D IMMUNE GLOBULIN 1500 UNIT/2ML IJ SOSY
300.0000 ug | PREFILLED_SYRINGE | Freq: Once | INTRAMUSCULAR | Status: AC
  Administered 2023-02-11: 300 ug via INTRAMUSCULAR
  Filled 2023-02-11: qty 2

## 2023-02-11 NOTE — Discharge Summary (Signed)
Postpartum Discharge Summary  Date of Service updated 02/6323     Patient Name: Karen Perkins DOB: 02-12-1991 MRN: [id number]  Date of admission: 02/09/2023 Delivery date:02/09/2023  Delivering provider: Charlotta Newton  Date of discharge: 02/11/2023  Admitting diagnosis: History of C-section [Z98.891] Intrauterine pregnancy: [redacted]w[redacted]d    Secondary diagnosis:  Principal Problem:   History of C-section  Additional problems:     Discharge diagnosis: Term Pregnancy Delivered                                              Post partum procedures: Augmentation: N/A Complications: None  Hospital course: Sceduled C/S   32y.o. yo [id number] admitted to the hospital 02/09/2023 for scheduled cesarean section with the following indication:Elective Repeat.Delivery details are as follows:  Membrane Rupture Time/Date: 1:34 PM ,02/09/2023   Delivery Method:C-Section, Low Transverse  Details of operation can be found in separate operative note.  Patient had a postpartum course complicated by.  She is ambulating, tolerating a regular diet, passing flatus, and urinating well. Patient is discharged home in stable condition on  02/11/23        Newborn Data: Birth date:02/09/2023  Birth time:1:34 PM  Gender:Female  Living status:Living  Apgars:9 ,9  Weight:3090 g     Magnesium Sulfate received: No BMZ received: No Rhophylac:Yes MMR:No T-DaP:Given prenatally Flu: No Transfusion:No  Physical exam  Vitals:   02/10/23 1008 02/10/23 1500 02/10/23 2134 02/11/23 0537  BP: 114/71 102/60 110/60 115/80  Pulse: 64 60 67 60  Resp: '16 17 16 17  '$ Temp: 97.9 F (36.6 C) 98 F (36.7 C) 98.2 F (36.8 C) 98.1 F (36.7 C)  TempSrc: Oral Oral Oral Oral  SpO2: 97% 97%  99%  Weight:      Height:       General: alert, cooperative, and no distress Lochia: appropriate Uterine Fundus: firm Incision: Healing well with no significant drainage DVT Evaluation: No evidence of DVT seen on physical  exam. Labs: Lab Results  Component Value Date   WBC 9.4 02/10/2023   HGB 10.2 (L) 02/10/2023   HCT 28.7 (L) 02/10/2023   MCV 96.6 02/10/2023   PLT 159 02/10/2023      Latest Ref Rng & Units 11/13/2022    9:23 PM  CMP  Glucose 70 - 99 mg/dL 86   BUN 6 - 20 mg/dL 7   Creatinine 0.44 - 1.00 mg/dL 0.58   Sodium 135 - 145 mmol/L 135   Potassium 3.5 - 5.1 mmol/L 3.3   Chloride 98 - 111 mmol/L 105   CO2 22 - 32 mmol/L 21   Calcium 8.9 - 10.3 mg/dL 8.7   Total Protein 6.5 - 8.1 g/dL 5.7   Total Bilirubin 0.3 - 1.2 mg/dL 0.4   Alkaline Phos 38 - 126 U/L 55   AST 15 - 41 U/L 16   ALT 0 - 44 U/L 13    Edinburgh Score:    02/09/2023    6:42 PM  Edinburgh Postnatal Depression Scale Screening Tool  I have been able to laugh and see the funny side of things. 0  I have looked forward with enjoyment to things. 0  I have blamed myself unnecessarily when things went wrong. 0  I have been anxious or worried for no good reason. 1  I have felt scared  or panicky for no good reason. 0  Things have been getting on top of me. 1  I have been so unhappy that I have had difficulty sleeping. 0  I have felt sad or miserable. 0  I have been so unhappy that I have been crying. 0  The thought of harming myself has occurred to me. 0  Edinburgh Postnatal Depression Scale Total 2      After visit meds:  Allergies as of 02/11/2023   No Known Allergies      Medication List     STOP taking these medications    capsaicin 0.025 % cream Commonly known as: ZOSTRIX   metroNIDAZOLE 500 MG tablet Commonly known as: FLAGYL       TAKE these medications    acetaminophen 325 MG tablet Commonly known as: TYLENOL Take 2 tablets (650 mg total) by mouth every 6 (six) hours as needed for mild pain (temperature > 101.5.).   ibuprofen 600 MG tablet Commonly known as: ADVIL Take 1 tablet (600 mg total) by mouth every 6 (six) hours as needed.   PRENATAL PO Take 1 tablet by mouth every evening.          Discharge home in stable condition Infant Feeding: Breast Infant Disposition:home with mother Discharge instruction: per After Visit Summary and Postpartum booklet. Activity: Advance as tolerated. Pelvic rest for 6 weeks.  Diet: routine diet Anticipated Birth Control: Unsure Postpartum Appointment:6 weeks Additional Postpartum F/U:  Future Appointments:No future appointments. Follow up Visit:      02/11/2023 Allena Katz, MD

## 2023-02-12 LAB — RH IG WORKUP (INCLUDES ABO/RH)
Antibody Screen: POSITIVE
Fetal Screen: NEGATIVE
Gestational Age(Wks): 39
Unit division: 0

## 2023-02-19 ENCOUNTER — Telehealth (HOSPITAL_COMMUNITY): Payer: Self-pay | Admitting: *Deleted

## 2023-02-19 NOTE — Telephone Encounter (Signed)
Attempted Hospital Discharge Follow-Up Call.  Left voice mail requesting that patient return RN's phone call if patient has any concerns or questions.
